# Patient Record
Sex: Female | Born: 1990
Health system: Southern US, Community
[De-identification: ages and names within clinical notes are randomized; demographics above are authoritative.]

## PROBLEM LIST (undated history)

## (undated) ENCOUNTER — Inpatient Hospital Stay (HOSPITAL_COMMUNITY): Payer: Self-pay

## (undated) DIAGNOSIS — O139 Gestational [pregnancy-induced] hypertension without significant proteinuria, unspecified trimester: Secondary | ICD-10-CM

## (undated) DIAGNOSIS — D649 Anemia, unspecified: Secondary | ICD-10-CM

## (undated) DIAGNOSIS — R51 Headache: Secondary | ICD-10-CM

## (undated) DIAGNOSIS — O24419 Gestational diabetes mellitus in pregnancy, unspecified control: Secondary | ICD-10-CM

## (undated) DIAGNOSIS — F329 Major depressive disorder, single episode, unspecified: Secondary | ICD-10-CM

## (undated) DIAGNOSIS — Z202 Contact with and (suspected) exposure to infections with a predominantly sexual mode of transmission: Secondary | ICD-10-CM

## (undated) DIAGNOSIS — I10 Essential (primary) hypertension: Secondary | ICD-10-CM

## (undated) DIAGNOSIS — R519 Headache, unspecified: Secondary | ICD-10-CM

---
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
## 2009-03-05 ENCOUNTER — Emergency Department (HOSPITAL_COMMUNITY): Admission: EM | Admit: 2009-03-05 | Discharge: 2009-03-05 | Payer: Self-pay | Admitting: Emergency Medicine

## 2010-04-24 ENCOUNTER — Emergency Department (HOSPITAL_COMMUNITY): Admission: EM | Admit: 2010-04-24 | Discharge: 2010-04-24 | Payer: Self-pay | Admitting: Emergency Medicine

## 2010-07-05 ENCOUNTER — Emergency Department (HOSPITAL_COMMUNITY): Admission: EM | Admit: 2010-07-05 | Discharge: 2010-07-05 | Payer: Self-pay | Admitting: Emergency Medicine

## 2011-02-02 LAB — URINE MICROSCOPIC-ADD ON

## 2011-02-02 LAB — URINALYSIS, ROUTINE W REFLEX MICROSCOPIC
Protein, ur: 100 mg/dL — AB
Specific Gravity, Urine: 1.039 — ABNORMAL HIGH (ref 1.005–1.030)
pH: 6 (ref 5.0–8.0)

## 2011-02-02 LAB — POCT I-STAT, CHEM 8
BUN: 6 mg/dL (ref 6–23)
Hemoglobin: 12.6 g/dL (ref 12.0–15.0)
Potassium: 3.3 mEq/L — ABNORMAL LOW (ref 3.5–5.1)
Sodium: 139 mEq/L (ref 135–145)
TCO2: 25 mmol/L (ref 0–100)

## 2011-02-02 LAB — RAPID STREP SCREEN (MED CTR MEBANE ONLY): Streptococcus, Group A Screen (Direct): POSITIVE — AB

## 2011-07-22 LAB — RPR: RPR Ser Ql: NONREACTIVE

## 2011-07-22 LAB — RH IMMUNE GLOB WKUP(>/=20WKS)(NOT WOMEN'S HOSP): Fetal Screen: NEGATIVE

## 2011-07-22 LAB — CBC
HCT: 26 — ABNORMAL LOW
HCT: 31.3 — ABNORMAL LOW
HCT: 32.3 — ABNORMAL LOW
Hemoglobin: 10.3 — ABNORMAL LOW
Hemoglobin: 8.6 — ABNORMAL LOW
MCHC: 33
MCHC: 33
MCV: 79.3
MCV: 80.1
Platelets: 187
Platelets: 214
RBC: 3.25 — ABNORMAL LOW
RDW: 14.1

## 2011-07-22 LAB — URINALYSIS, ROUTINE W REFLEX MICROSCOPIC
Glucose, UA: NEGATIVE
Ketones, ur: >80 — AB
Nitrite: NEGATIVE
Urobilinogen, UA: 1

## 2011-07-22 LAB — URINE CULTURE

## 2011-07-22 LAB — URINE MICROSCOPIC-ADD ON

## 2011-08-03 LAB — POCT PREGNANCY, URINE: Operator id: 116391

## 2011-08-05 LAB — GC/CHLAMYDIA PROBE AMP, GENITAL
Chlamydia, DNA Probe: NEGATIVE
GC Probe Amp, Genital: POSITIVE — AB

## 2011-08-05 LAB — WET PREP, GENITAL

## 2012-08-15 ENCOUNTER — Emergency Department (HOSPITAL_COMMUNITY)
Admission: EM | Admit: 2012-08-15 | Discharge: 2012-08-15 | Disposition: A | Discharge status: Home / Self Care | Payer: Self-pay | Attending: Emergency Medicine | Admitting: Emergency Medicine

## 2012-08-15 ENCOUNTER — Encounter (HOSPITAL_COMMUNITY): Payer: Self-pay | Admitting: *Deleted

## 2012-08-15 DIAGNOSIS — F17200 Nicotine dependence, unspecified, uncomplicated: Secondary | ICD-10-CM | POA: Insufficient documentation

## 2012-08-15 DIAGNOSIS — K0889 Other specified disorders of teeth and supporting structures: Secondary | ICD-10-CM

## 2012-08-15 DIAGNOSIS — K089 Disorder of teeth and supporting structures, unspecified: Secondary | ICD-10-CM | POA: Insufficient documentation

## 2012-08-15 MED ORDER — PENICILLIN V POTASSIUM 500 MG PO TABS: 500.0000 mg | ORAL_TABLET | Freq: Three times a day (TID) | ORAL | Status: DC

## 2012-08-15 MED ORDER — HYDROCODONE-ACETAMINOPHEN 5-325 MG PO TABS
1.0000 | ORAL_TABLET | Freq: Once | ORAL | Status: AC
  Administered 2012-08-15: 1 via ORAL
  Filled 2012-08-15: qty 1

## 2012-08-15 MED ORDER — PENICILLIN V POTASSIUM 500 MG PO TABS
500.0000 mg | ORAL_TABLET | Freq: Once | ORAL | Status: AC
  Administered 2012-08-15: 500 mg via ORAL

## 2012-08-15 MED ORDER — PENICILLIN V POTASSIUM 500 MG PO TABS
ORAL_TABLET | ORAL | Status: AC
  Administered 2012-08-15: 500 mg via ORAL
  Filled 2012-08-15: qty 1

## 2012-08-15 MED ORDER — HYDROCODONE-ACETAMINOPHEN 5-500 MG PO TABS: 1.0000 | ORAL_TABLET | Freq: Four times a day (QID) | ORAL | Status: DC | PRN

## 2012-08-15 MED ORDER — PENICILLIN G BENZATHINE & PROC 1200000 UNIT/2ML IM SUSP: 1.2000 10*6.[IU] | Freq: Once | INTRAMUSCULAR | Status: DC

## 2012-08-15 NOTE — ED Notes (Addendum)
Pt reports R lower toothache x 3 days.  Pt reports cavity and does not have a dentist.  Pt also reports h/a and R jaw pain.

## 2012-08-15 NOTE — ED Provider Notes (Signed)
History     CSN: [id number]  Arrival date & time 08/15/12  1250   First MD Initiated Contact with Patient 08/15/12 1336      Chief Complaint  Patient presents with  . Dental Pain    (Consider location/radiation/quality/duration/timing/severity/associated sxs/prior treatment) HPI  21 year old female presents complaining of dental pain. Patient reports for the past 3 days she has had persistent pain to her right lower tooth. Onset was gradual, persistent, radiating, and moderate severity. Pain is described as a throbbing sensation worsening with hot and cold water, or with cold air. Pain  radiates to the right jaw pain, is giving her a headache. She denies any associated swelling. She acknowledged that she has a cavity to the affected tooth. Patient has been tried over-the-counter medication including ibuprofen and Tylenol without adequate relief. She denies fever, rash, hearing changes, or neck pain.  History reviewed. No pertinent past medical history.  History reviewed. No pertinent past surgical history.  No family history on file.  History  Substance Use Topics  . Smoking status: Current Every Day Smoker -- 0.5 packs/day    Types: Cigarettes  . Smokeless tobacco: Not on file  . Alcohol Use: Yes     occa    OB History    Grav Para Term Preterm Abortions TAB SAB Ect Mult Living                  Review of Systems  Constitutional: Negative for fever.  HENT: Positive for dental problem. Negative for sore throat, trouble swallowing and voice change.   Neurological: Negative for numbness.    Allergies  Review of patient's allergies indicates no known allergies.  Home Medications   Current Outpatient Rx  Name Route Sig Dispense Refill  . ACETAMINOPHEN 325 MG PO TABS Oral Take 650 mg by mouth every 6 (six) hours as needed. Pain    . ASPIRIN-SALICYLAMIDE-CAFFEINE 650-195-33.3 MG PO PACK Oral Take 1 Package by mouth as needed. Pain    . IBUPROFEN 200 MG PO TABS Oral  Take 200 mg by mouth every 6 (six) hours as needed. Pain      BP 131/77  Pulse 81  Temp 99.5 F (37.5 C) (Oral)  Resp 20  SpO2 100%  LMP 08/14/2012  Physical Exam  Nursing note and vitals reviewed. Constitutional: She appears well-developed and well-nourished. No distress.  HENT:  Head: Atraumatic.  Right Ear: External ear normal.  Left Ear: External ear normal.  Nose: Nose normal.  Mouth/Throat: Oropharynx is clear and moist. No oropharyngeal exudate.    Eyes: Conjunctivae normal are normal.  Neck: Normal range of motion. Neck supple.  Lymphadenopathy:    She has no cervical adenopathy.  Neurological: She is alert.  Skin: Skin is warm. No rash noted.  Psychiatric: She has a normal mood and affect.    ED Course  Procedures (including critical care time)  Labs Reviewed - No data to display No results found.   No diagnosis found.  1. Dental pain  MDM  Dental decay and dental pain to R lower molar.  Will prescribe abx, pain meds and referral to dentist.    BP 131/77  Pulse 81  Temp 99.5 F (37.5 C) (Oral)  Resp 20  SpO2 100%  LMP 08/14/2012       Fayrene Helper, PA-C 08/15/12 1359

## 2012-08-16 NOTE — ED Provider Notes (Signed)
Medical screening examination/treatment/procedure(s) were performed by non-physician practitioner and as supervising physician I was immediately available for consultation/collaboration.   Kaileen Bronkema M Alanna Storti, DO 08/16/12 2024 

## 2013-03-26 ENCOUNTER — Encounter (HOSPITAL_COMMUNITY): Payer: Self-pay | Admitting: *Deleted

## 2013-03-26 ENCOUNTER — Emergency Department (HOSPITAL_COMMUNITY)
Admission: EM | Admit: 2013-03-26 | Discharge: 2013-03-26 | Disposition: A | Discharge status: Home / Self Care | Payer: Medicaid Other | Attending: Emergency Medicine | Admitting: Emergency Medicine

## 2013-03-26 DIAGNOSIS — G43909 Migraine, unspecified, not intractable, without status migrainosus: Secondary | ICD-10-CM | POA: Insufficient documentation

## 2013-03-26 DIAGNOSIS — K0889 Other specified disorders of teeth and supporting structures: Secondary | ICD-10-CM

## 2013-03-26 DIAGNOSIS — R51 Headache: Secondary | ICD-10-CM | POA: Insufficient documentation

## 2013-03-26 DIAGNOSIS — H9209 Otalgia, unspecified ear: Secondary | ICD-10-CM | POA: Insufficient documentation

## 2013-03-26 DIAGNOSIS — K089 Disorder of teeth and supporting structures, unspecified: Secondary | ICD-10-CM | POA: Insufficient documentation

## 2013-03-26 DIAGNOSIS — R6884 Jaw pain: Secondary | ICD-10-CM | POA: Insufficient documentation

## 2013-03-26 DIAGNOSIS — F17200 Nicotine dependence, unspecified, uncomplicated: Secondary | ICD-10-CM | POA: Insufficient documentation

## 2013-03-26 MED ORDER — OXYCODONE-ACETAMINOPHEN 5-325 MG PO TABS
2.0000 | ORAL_TABLET | Freq: Once | ORAL | Status: AC
  Administered 2013-03-26: 2 via ORAL
  Filled 2013-03-26: qty 2

## 2013-03-26 MED ORDER — PENICILLIN V POTASSIUM 500 MG PO TABS: 500.0000 mg | ORAL_TABLET | Freq: Four times a day (QID) | ORAL | Status: AC

## 2013-03-26 MED ORDER — NAPROXEN 375 MG PO TABS: 375.0000 mg | ORAL_TABLET | Freq: Two times a day (BID) | ORAL | Status: DC

## 2013-03-26 NOTE — ED Notes (Signed)
Pt is here with dental pain to wisdom teeth areas that have caused her ear pain and migraine pain

## 2013-03-26 NOTE — ED Provider Notes (Signed)
History    This chart was scribed for Raymon Mutton, PA working with Ward Givens, MD by ED Scribe, Burman Nieves. This patient was seen in room TR06C/TR06C and the patient's care was started at 7:53 PM.   CSN: [id number]  Arrival date & time 03/26/13  1652   First MD Initiated Contact with Patient 03/26/13 1953      Chief Complaint  Patient presents with  . Dental Pain    (Consider location/radiation/quality/duration/timing/severity/associated sxs/prior treatment) Patient is a 22 y.o. female presenting with tooth pain. The history is provided by the patient. No language interpreter was used.  Dental Pain Quality:  Throbbing Severity:  Moderate Onset quality:  Gradual Timing:  Constant Progression:  Worsening Chronicity:  Recurrent Context: dental fracture   Associated symptoms: headaches    HPI Comments: Laura Leonard is a 22 y.o. female who presents to the Emergency Department complaining of moderate constant throbbing dental pain in her wisdom teeth areas that have caused associated ear pain and migraines that has been going on for the past year and half. Pt states that her upper and lower jaw hurt. Pt states she came to the ED last year with a cracked tooth. She states it has gotten progressively worse since cracked tooth. She states that eating (especially hot and cold foods) exacerbates the pain. She states she takes Tylenol PM to help with sleeping and has taken ibuprofen with no immediate relief. Whenever she touches her tooth to get food out she experiences a sharp piercing pain. Pt denies headaches, chest pain, dizziness, fever, chills, cough, nausea, vomiting, diarrhea, SOB, weakness, and any other associated symptoms. Pt states that she has been unable to get the problem fixed due to not having a dentist and would like to get a referral today.    History reviewed. No pertinent past medical history.  History reviewed. No pertinent past surgical history.  No family  history on file.  History  Substance Use Topics  . Smoking status: Current Every Day Smoker -- 0.50 packs/day    Types: Cigarettes  . Smokeless tobacco: Not on file  . Alcohol Use: Yes     Comment: occa    OB History   Grav Para Term Preterm Abortions TAB SAB Ect Mult Living                  Review of Systems  HENT: Positive for dental problem.   Neurological: Positive for headaches.  All other systems reviewed and are negative.    Allergies  Review of patient's allergies indicates no known allergies.  Home Medications   Current Outpatient Rx  Name  Route  Sig  Dispense  Refill  . acetaminophen (TYLENOL) 500 MG tablet   Oral   Take 1,000 mg by mouth every 2 (two) hours as needed for pain. For pain         . Ibuprofen-Diphenhydramine Cit (ADVIL PM PO)   Oral   Take 4 tablets by mouth daily as needed. For pain         . naproxen (NAPROSYN) 375 MG tablet   Oral   Take 1 tablet (375 mg total) by mouth 2 (two) times daily.   20 tablet   0   . penicillin v potassium (VEETID) 500 MG tablet   Oral   Take 1 tablet (500 mg total) by mouth 4 (four) times daily.   40 tablet   0     BP 133/85  Pulse 84  Temp(Src) 98.2  F (36.8 C) (Oral)  Resp 18  SpO2 99%  LMP 03/08/2013  Physical Exam  Nursing note and vitals reviewed. Constitutional: She is oriented to person, place, and time. She appears well-developed and well-nourished. No distress.  HENT:  Head: Normocephalic and atraumatic.  Mouth/Throat: Uvula is midline. No oropharyngeal exudate.  Cracked upper 2nd molar and bottom 2nd molar on the right side.   Negative facial swelling noted Negative erythema, inflammation, swelling, lesions, abscesses, masses, cysts noted to buccal mucosa Negative sublingual lesion Negative trismus  Eyes: Conjunctivae and EOM are normal. Pupils are equal, round, and reactive to light. No scleral icterus.  Neck: Normal range of motion. Neck supple. No tracheal deviation  present.  Negative neck stiffness Negative nuchal rigidity Negative lymphadenopathy  Negative pain upon palpation to the neck   Cardiovascular: Normal rate, regular rhythm, normal heart sounds and intact distal pulses.  Exam reveals no gallop.   No murmur heard. Pulses:      Radial pulses are 2+ on the right side, and 2+ on the left side.  Pulmonary/Chest: Effort normal and breath sounds normal. No respiratory distress. She has no wheezes. She has no rales. She exhibits no tenderness.  Abdominal: Soft. Bowel sounds are normal. She exhibits no mass. There is no tenderness. There is no guarding.  Musculoskeletal: Normal range of motion.  Lymphadenopathy:    She has no cervical adenopathy.  Neurological: She is alert and oriented to person, place, and time. No cranial nerve deficit. She exhibits normal muscle tone. Coordination normal.  Cranial nerves III-XII grossly intact  Skin: Skin is warm and dry. She is not diaphoretic.  Psychiatric: She has a normal mood and affect. Her behavior is normal.    ED Course  Procedures (including critical care time) DIAGNOSTIC STUDIES: Oxygen Saturation is 99% on room air, normal by my interpretation.    COORDINATION OF CARE:  8:42 PM Discussed ED treatment with pt and pt agrees.    Labs Reviewed - No data to display No results found.   1. Pain, dental       MDM  I personally performed the services described in this documentation, which was scribed in my presence. The recorded information has been reviewed and is accurate.  Patient is stable, afebrile. Dental pain has been ongoing for the past year and a half. Negative trismus, negative sublingual cyst or lesion - ruling out Ludwig's Angina. Negative abscess or cyst formation, inflammation - ruling out possible infection. Negative peritonsillar abscess. Patient discharged. Discharged with antibiotic coverage and anti-inflammatories. Referred to dentist. Rest and hydrate. Discussed to monitor  symptoms and if symptoms are to worsen or change to report back to the ED. Resource guide and dental list given to patient.  Patient agreed to plan of care, understood, all questions answered.    Raymon Mutton, PA-C 03/27/13 [phone]

## 2013-03-27 NOTE — ED Provider Notes (Signed)
Medical screening examination/treatment/procedure(s) were performed by non-physician practitioner and as supervising physician I was immediately available for consultation/collaboration. Devoria Albe, MD, Armando Gang   Ward Givens, MD 03/27/13 [phone]

## 2013-10-27 ENCOUNTER — Emergency Department (HOSPITAL_COMMUNITY)
Admission: EM | Admit: 2013-10-27 | Discharge: 2013-10-27 | Disposition: A | Discharge status: Home / Self Care | Payer: Medicaid Other | Attending: Emergency Medicine | Admitting: Emergency Medicine

## 2013-10-27 ENCOUNTER — Encounter (HOSPITAL_COMMUNITY): Payer: Self-pay | Admitting: Emergency Medicine

## 2013-10-27 DIAGNOSIS — F17200 Nicotine dependence, unspecified, uncomplicated: Secondary | ICD-10-CM | POA: Insufficient documentation

## 2013-10-27 DIAGNOSIS — K0889 Other specified disorders of teeth and supporting structures: Secondary | ICD-10-CM

## 2013-10-27 DIAGNOSIS — K089 Disorder of teeth and supporting structures, unspecified: Secondary | ICD-10-CM | POA: Insufficient documentation

## 2013-10-27 MED ORDER — PENICILLIN V POTASSIUM 500 MG PO TABS: 500.0000 mg | ORAL_TABLET | Freq: Four times a day (QID) | ORAL | Status: DC

## 2013-10-27 MED ORDER — NAPROXEN 500 MG PO TABS: 500.0000 mg | ORAL_TABLET | Freq: Two times a day (BID) | ORAL | Status: DC

## 2013-10-27 MED ORDER — BUPIVACAINE-EPINEPHRINE PF 0.5-1:200000 % IJ SOLN
1.8000 mL | Freq: Once | INTRAMUSCULAR | Status: AC
  Administered 2013-10-27: 9 mg
  Filled 2013-10-27: qty 1.8

## 2013-10-27 NOTE — ED Notes (Signed)
Arrived by EMS for right lower dental pain x 1 week. Pt is stable. In NAD

## 2013-10-27 NOTE — ED Provider Notes (Signed)
CSN: [id number]     Arrival date & time 10/27/13  [id number] History   First MD Initiated Contact with Patient 10/27/13 [phone]     Chief Complaint  Patient presents with  . Dental Pain   (Consider location/radiation/quality/duration/timing/severity/associated sxs/prior Treatment) HPI Comments: Patient is a 23 year old female who presents today by EMS for dental pain. She has had this dental pain for the past couple of years, but worse in the past 3 days. It is a throbbing pain in her right lower molar. The pain radiates into her right ear. She has taken ibuprofen without relief. She reports the pain is so severe she has not been able to sleep. She is sensitive to heat and cold. Pt denies fever, chills, nausea, vomiting, trismus.   The history is provided by the patient. No language interpreter was used.    History reviewed. No pertinent past medical history. History reviewed. No pertinent past surgical history. No family history on file. History  Substance Use Topics  . Smoking status: Current Every Day Smoker -- 0.50 packs/day    Types: Cigarettes  . Smokeless tobacco: Not on file  . Alcohol Use: Yes     Comment: occa   OB History   Grav Para Term Preterm Abortions TAB SAB Ect Mult Living                 Review of Systems  Constitutional: Negative for fever and chills.  HENT: Positive for dental problem. Negative for sore throat and trouble swallowing.   Respiratory: Negative for shortness of breath.   Cardiovascular: Negative for chest pain.  Gastrointestinal: Negative for nausea, vomiting and abdominal pain.  All other systems reviewed and are negative.    Allergies  Review of patient's allergies indicates no known allergies.  Home Medications   Current Outpatient Rx  Name  Route  Sig  Dispense  Refill  . acetaminophen (TYLENOL) 500 MG tablet   Oral   Take 1,000 mg by mouth every 2 (two) hours as needed for pain. For pain         . Ibuprofen-Diphenhydramine Cit (ADVIL PM  PO)   Oral   Take 4 tablets by mouth daily as needed. For pain         . naproxen (NAPROSYN) 375 MG tablet   Oral   Take 1 tablet (375 mg total) by mouth 2 (two) times daily.   20 tablet   0    BP 131/76  Pulse 92  Temp(Src) 98.8 F (37.1 C) (Oral)  Resp 20  SpO2 98% Physical Exam  Nursing note and vitals reviewed. Constitutional: She is oriented to person, place, and time. She appears well-developed and well-nourished. No distress.  HENT:  Head: Normocephalic and atraumatic.  Right Ear: External ear normal.  Left Ear: External ear normal.  Nose: Nose normal.  Mouth/Throat: Oropharynx is clear and moist.  Broken right lower molar.  No trismus, submental edema, tongue elevation.  Eyes: Conjunctivae are normal.  Neck: Normal range of motion.  Cardiovascular: Normal rate, regular rhythm and normal heart sounds.   Pulmonary/Chest: Effort normal and breath sounds normal. No stridor. No respiratory distress. She has no wheezes. She has no rales.  Abdominal: Soft. She exhibits no distension.  Musculoskeletal: Normal range of motion.  Neurological: She is alert and oriented to person, place, and time. She has normal strength.  Skin: Skin is warm and dry. She is not diaphoretic. No erythema.  Psychiatric: She has a normal mood and affect.  Her behavior is normal.    ED Course  Dental Date/Time: 10/27/2013 7:04 AM Performed by: Mora Bellman Authorized by: Mora Bellman Consent: Verbal consent obtained. written consent not obtained. The procedure was performed in an emergent situation. Risks and benefits: risks, benefits and alternatives were discussed Consent given by: patient Patient understanding: patient states understanding of the procedure being performed Patient consent: the patient's understanding of the procedure matches consent given Required items: required blood products, implants, devices, and special equipment available Patient identity confirmed: verbally  with patient and arm band Time out: Immediately prior to procedure a "time out" was called to verify the correct patient, procedure, equipment, support staff and site/side marked as required. Preparation: Patient was prepped and draped in the usual sterile fashion. Local anesthesia used: yes Anesthesia: local infiltration Local anesthetic: bupivacaine 0.25% with epinephrine Anesthetic total: 3.6 ml Patient sedated: no Patient tolerance: Patient tolerated the procedure well with no immediate complications.   (including critical care time) Labs Review Labs Reviewed - No data to display Imaging Review No results found.  EKG Interpretation   None       MDM   1. Pain, dental    Patient with toothache.  No gross abscess.  Exam unconcerning for Ludwig's angina or spread of infection.  Will treat with penicillin and anti inflammatories.  Urged patient to follow-up with dentist.      Mora Bellman, PA-C 10/27/13 [phone]

## 2013-10-27 NOTE — ED Notes (Signed)
Bed: [id number] Expected date:  Expected time:  Means of arrival:  Comments: EMS lower jaw pain

## 2013-10-27 NOTE — Discharge Instructions (Signed)
Dental Pain Toothache is pain in or around a tooth. It may get worse with chewing or with cold or heat.  HOME CARE  Your dentist may use a numbing medicine during treatment. If so, you may need to avoid eating until the medicine wears off. Ask your dentist about this.  Only take medicine as told by your dentist or doctor.  Avoid chewing food near the painful tooth until after all treatment is done. Ask your dentist about this. GET HELP RIGHT AWAY IF:   The problem gets worse or new problems appear.  You have a fever.  There is redness and puffiness (swelling) of the face, jaw, or neck.  You cannot open your mouth.  There is pain in the jaw.  There is very bad pain that is not helped by medicine. MAKE SURE YOU:   Understand these instructions.  Will watch your condition.  Will get help right away if you are not doing well or get worse. Document Released: 03/29/2008 Document Revised: 01/03/2012 Document Reviewed: 03/29/2008 [facility] Patient Information 2014 [place], [place].   Emergency Department Resource Guide 1) Find a Doctor and Pay Out of Pocket Although you won't have to find out who is covered by your insurance plan, it is a good idea to ask around and get recommendations. You will then need to call the office and see if the doctor you have chosen will accept you as a new patient and what types of options they offer for patients who are self-pay. Some doctors offer discounts or will set up payment plans for their patients who do not have insurance, but you will need to ask so you aren't surprised when you get to your appointment.  2) Contact Your Local Health Department Not all health departments have doctors that can see patients for sick visits, but many do, so it is worth a call to see if yours does. If you don't know where your local health department is, you can check in your phone book. The CDC also has a tool to help you locate your state's health department, and many  state websites also have listings of all of their local health departments.  3) Find a [facility] If your illness is not likely to be very severe or complicated, you may want to try a walk in clinic. These are popping up all over the country in pharmacies, drugstores, and shopping centers. They're usually staffed by nurse practitioners or physician assistants that have been trained to treat common illnesses and complaints. They're usually fairly quick and inexpensive. However, if you have serious medical issues or chronic medical problems, these are probably not your best option.  No Primary Care Doctor: - Call Health Connect at  [phone] - they can help you locate a primary care doctor that  accepts your insurance, provides certain services, etc. - Physician Referral Service- [phone]  Chronic Pain Problems: Organization         Address  Phone   Notes  [facility]  [phone] Patients need to be referred by their primary care doctor.   Medication Assistance: Organization         Address  Phone   Notes  [facility] [address] [place],  [address] [phone] --Must be a resident of [facility] -- Must have NO insurance coverage whatsoever (no Medicaid/ Medicare, etc.) -- The pt. MUST have a primary care doctor that directs their care regularly and follows  them in the community   [facility]  [phone]   [employer]  [phone]    Agencies that provide inexpensive medical care: Organization         Address  Phone   Notes  [place]  [phone]   [facility]    [phone]   [facility] [place], [place] [address] [phone]   [place] [address], [place] [phone]   Planned Parenthood    [phone]   [facility]    [phone]   [place] and  [address], [place] Phone:  [phone], Fax:  [phone] Hours of Operation:  9 am - 6 pm, M-F.  Also accepts Medicaid/Medicare and self-pay.  [facility] for [address], [address], [place] Phone: [phone], Fax: [phone]. Hours of Operation:  8:30 am - 5:30 pm, M-F.  Also accepts Medicaid and self-pay.  [facility] [place] [address] [place] Phone: [phone]   [place], [place], [place] [phone], Ext. [phone] Mondays & Thursdays: 7-9 AM.  First 15 patients are seen on a first come, first serve basis.    [place] Providers:  Organization         Address  Phone   Notes  [facility] [address], [place] [phone] Also accepts self-pay patients.  [facility] [address], [place]  [phone]   [place], [address], [place] [phone]   [facility] [address], [place] [phone]   Lucianne Lei [address], [place]   [phone] Only accepts [place] patients after they have their name applied to their card.   Self-Pay (no insurance) in [facility]:  Organization         Address  Phone   Notes  Sickle Cell Patients, [facility] [place] [phone]   [facility] [place] [phone]   [facility] [place]  [place], [place], [place] [phone]   [facility]/Dr. Osei-Bonsu  [address] [place] [address], [place] or [address], [place] [phone] Phone number for both Institute and [place] locations is the same.  [facility] [address], [place] [phone]   [facility] [address] [place] or [address] [phone]   [facility] [address], [place] [phone], phone; [phone], fax Sees patients 1st and 3rd Saturday of every month.  Must not qualify for public or private insurance (i.e. Medicaid, Medicare, [place] Health Choice, Veterans' Benefits)  Household income should be no more than 200% of the poverty level The clinic cannot treat you if you are pregnant or think you are pregnant  Sexually transmitted diseases are not treated at the clinic.    Dental Care: Organization         Address  Phone  Notes  [facility] [address] [place], [place] [phone] Accepts children up to age 15 who are enrolled  in Medicaid or [place] Health Choice; pregnant women with a Medicaid card; and children who have applied for Medicaid or [place] Health Choice, but were declined, whose parents can pay a reduced fee at time of service.  °[facility] [place]  [address], [place] [phone] Accepts children up to age 21 who are enrolled in Medicaid or [place] Health Choice; pregnant women with a Medicaid card; and children who have applied for Medicaid or [place] Health Choice, but were declined, whose parents can pay a reduced fee at time of service.  °[facility] ° [address], [place] [phone] Patients are seen by appointment only. Walk-ins are not accepted. [facility] will see patients 18 years of age and older. °Monday - Tuesday (8am-5pm) °Most Wednesdays (8:30-5pm) °$30 per visit, cash only  °[facility] ° [address], [place] [phone] Patients are seen by appointment only. Walk-ins are not accepted. [facility] will see patients 18 years of age and older. °One Wednesday Evening (Monthly: Volunteer Based).  $30 per visit, cash only  °[facility]  [phone] for adults; Children under age 4, call Graduate Pediatric Dentistry at [phone]. Children aged 4-14, please call [phone] to request a pediatric application. ° Dental services are provided in all areas of dental care including fillings, crowns and bridges, complete and partial dentures, implants, gum treatment, root canals, and extractions. Preventive care is also provided. Treatment is provided to both adults and children. °Patients are selected via a lottery and there is often a waiting list. °  °[facility] [address], °[place] ° ([phone] [email or web address] °  °[facility] [address], [place], [place] ([phone], Ext. [phone] Second and Fourth Thursday of each month, opens at 6:30 AM; Clinic ends at 9 AM.  Patients are seen on a first-come first-served basis, and a limited number are seen during each clinic.  ° °[facility] ° [address], [place], [place] [phone]   Eligibility Requirements °You must have lived in [place], [place], or [place] counties for at least the last three months. °  You cannot be eligible for state or federal sponsored healthcare insurance, including Veterans Administration, Medicaid, or Medicare. °  You generally cannot be eligible for healthcare insurance through your employer.  °  How to apply: °Eligibility screenings are held every Tuesday and Wednesday afternoon from 1:00 pm until 4:00 pm. You do not need an appointment for the interview!  °[facility] [address], [place], [place] [phone]   °[facility]  [phone]   °[facility]  [phone]   °[facility]  336-570-6415   ° °Behavioral Health Resources in the Community: °Intensive Outpatient Programs °Organization         Address  Phone  Notes  °[facility] [address], [place], [place]  [phone]   °[facility] [address], [place], [place] [phone]   °[facility] [address], [place], [place] ° 336-882-2125   °[facility] [address],  °[place], [place] 1-800-853-5163 or 336-641-4981   °Substance Abuse Resources °Organization         Address  Phone  Notes  °[facility]  336-882-2125   °[facility]  336-784-9470   °  [facility] [place]   Chinita Pester  (469)[phone]   Residential & Outpatient Substance Abuse Program  (850) [phone]   Psychological Services Organization         Address  Phone  Notes  [facility] [place]  [place]  ([phone]   [facility] [address] [place] or [phone]    Mobile Crisis Teams Organization         Address  Phone  Notes  [facility], Mobile Crisis Care Unit  [phone]   [facility]  [address] [place], [place]   [place] [address], [place] [phone]    Self-Help/Support Groups Organization         Address  Phone             Notes  [place] [place] - variety of support groups  [place] Call for more information  Narcotics Anonymous (NA), [facility] [address], [employer] [place]  2 meetings at this location   Special educational needs teacher         Address  Phone  Notes  [facility] [place],    [place]  [phone]-([phone]   [facility]  [address], [place] [id number], [place], [place]   [place], [place] [phone] Admissions: 8am-3pm M-F  Incent[facility] Substance [place] [address],    [place], [place] [phone]   [facility] [address] [place], [place], [place]   [facility] [address],    [place], [place]   [facility] - Intensive Outpatient [address], [place], [place]   [facility] ([facility] [place].,  [place], [place] 1-712-332-1299 or [phone]   [facility] ([facility]) [address], [place], [place] Accepts Medicaid  [facility] [place] [address]  [place] 1-646-085-7975 Substance Abuse/Addiction Treatment   [facility] Organization         Address  Phone  Notes  [facility]  [phone]   Domenic Schwab, PhD [address] Arlis Porta [place], [place]   [phone] or [phone]   [facility] [place], [place] [phone]   [facility] [address] [place], [place] [phone] Insurance/Medicaid/sponsorship through [facility] and Families [address] [place]                                    [place], [place] [phone] [place] [address], [place] [phone]    Dr. Adele Schilder  [phone]   [facility] Dept. 1) [address] [place] 2) [place] 3)  [place] [address], [place] [phone]  [phone]   [place] [phone] or [phone] (After Hours)

## 2013-10-27 NOTE — ED Notes (Signed)
Right lower wisdom tooth is broke off. Pt has no dentist.

## 2013-10-28 NOTE — ED Provider Notes (Signed)
Medical screening examination/treatment/procedure(s) were performed by non-physician practitioner and as supervising physician I was immediately available for consultation/collaboration.  EKG Interpretation   None          Prathik Aman M Selby Foisy, MD 10/28/13 0052 

## 2013-11-01 ENCOUNTER — Other Ambulatory Visit (HOSPITAL_COMMUNITY)
Admission: RE | Admit: 2013-11-01 | Discharge: 2013-11-01 | Disposition: A | Discharge status: Home / Self Care | Payer: Medicaid Other | Source: Ambulatory Visit | Attending: Family Medicine | Admitting: Family Medicine

## 2013-11-01 ENCOUNTER — Encounter (HOSPITAL_COMMUNITY): Payer: Self-pay | Admitting: Emergency Medicine

## 2013-11-01 ENCOUNTER — Emergency Department (HOSPITAL_COMMUNITY)
Admission: EM | Admit: 2013-11-01 | Discharge: 2013-11-01 | Disposition: A | Discharge status: Home / Self Care | Payer: Medicaid Other | Source: Home / Self Care

## 2013-11-01 DIAGNOSIS — N760 Acute vaginitis: Secondary | ICD-10-CM | POA: Insufficient documentation

## 2013-11-01 DIAGNOSIS — K089 Disorder of teeth and supporting structures, unspecified: Secondary | ICD-10-CM

## 2013-11-01 DIAGNOSIS — K0889 Other specified disorders of teeth and supporting structures: Secondary | ICD-10-CM

## 2013-11-01 DIAGNOSIS — Z113 Encounter for screening for infections with a predominantly sexual mode of transmission: Secondary | ICD-10-CM | POA: Insufficient documentation

## 2013-11-01 DIAGNOSIS — N898 Other specified noninflammatory disorders of vagina: Secondary | ICD-10-CM

## 2013-11-01 LAB — POCT URINALYSIS DIP (DEVICE)
GLUCOSE, UA: NEGATIVE mg/dL
Ketones, ur: NEGATIVE mg/dL
LEUKOCYTES UA: NEGATIVE
NITRITE: NEGATIVE
PROTEIN: 30 mg/dL — AB
UROBILINOGEN UA: 1 mg/dL (ref 0.0–1.0)
pH: 6.5 (ref 5.0–8.0)

## 2013-11-01 LAB — POCT PREGNANCY, URINE: PREG TEST UR: NEGATIVE

## 2013-11-01 MED ORDER — CEFTRIAXONE SODIUM 1 G IJ SOLR
INTRAMUSCULAR | Status: AC
  Filled 2013-11-01: qty 10

## 2013-11-01 MED ORDER — HYDROCODONE-ACETAMINOPHEN 5-325 MG PO TABS
2.0000 | ORAL_TABLET | ORAL | Status: DC | PRN
Start: 2013-11-01 — End: 2014-10-14

## 2013-11-01 MED ORDER — AZITHROMYCIN 250 MG PO TABS: ORAL_TABLET | ORAL | Status: DC

## 2013-11-01 MED ORDER — AZITHROMYCIN 250 MG PO TABS
ORAL_TABLET | ORAL | Status: AC
Start: 2013-11-01 — End: 2013-11-01
  Filled 2013-11-01: qty 1

## 2013-11-01 MED ORDER — LIDOCAINE HCL (PF) 1 % IJ SOLN
INTRAMUSCULAR | Status: AC
  Filled 2013-11-01: qty 5

## 2013-11-01 MED ORDER — AMOXICILLIN 500 MG PO CAPS
1000.0000 mg | ORAL_CAPSULE | Freq: Three times a day (TID) | ORAL | Status: DC
  Administered 2013-11-01: 1000 mg via ORAL

## 2013-11-01 MED ORDER — CEFTRIAXONE SODIUM 1 G IJ SOLR
1.0000 g | Freq: Once | INTRAMUSCULAR | Status: AC
  Administered 2013-11-01: 1 g via INTRAMUSCULAR

## 2013-11-01 NOTE — ED Notes (Signed)
Pt c/o yellow vag d/c onset 2 weeks... Reports multiple partners have been dx/treated for gonorrhea  Sxs also include: dysuria... She is on her Menstrual Period today Also c/o dental pain onset 2-3 months... Went to [facility] on 10/[facility]/13; given Penicillin and Naproxen... Pt lost Rx for antibiotic She is alert w/no signs of acute distress.

## 2013-11-01 NOTE — Discharge Instructions (Signed)
Dental Pain A tooth ache may be caused by cavities (tooth decay). Cavities expose the nerve of the tooth to air and hot or cold temperatures. It may come from an infection or abscess (also called a boil or furuncle) around your tooth. It is also often caused by dental caries (tooth decay). This causes the pain you are having. DIAGNOSIS  Your caregiver can diagnose this problem by exam. TREATMENT   If caused by an infection, it may be treated with medications which kill germs (antibiotics) and pain medications as prescribed by your caregiver. Take medications as directed.  Only take over-the-counter or prescription medicines for pain, discomfort, or fever as directed by your caregiver.  Whether the tooth ache today is caused by infection or dental disease, you should see your dentist as soon as possible for further care. SEEK MEDICAL CARE IF: The exam and treatment you received today has been provided on an emergency basis only. This is not a substitute for complete medical or dental care. If your problem worsens or new problems (symptoms) appear, and you are unable to meet with your dentist, call or return to this location. SEEK IMMEDIATE MEDICAL CARE IF:   You have a fever.  You develop redness and swelling of your face, jaw, or neck.  You are unable to open your mouth.  You have severe pain uncontrolled by pain medicine. MAKE SURE YOU:   Understand these instructions.  Will watch your condition.  Will get help right away if you are not doing well or get worse. Document Released: 10/11/2005 Document Revised: 01/03/2012 Document Reviewed: 05/29/2008 [facility] Patient Information 2014 [place]. Chlamydia, Female Chlamydia is an infection caused by a certain type of germ (bacteria). The germs are spread from one person to another person during sexual contact. This infection can be in the cervix, urine tube (urethra), throat, or bottom (rectum). This infection needs  treatment. HOME CARE   Take your medicines (antibiotics) as told. Finish them even if you start to feel better.  Only take medicine as told by your doctor.  Tell your sex partner(s) that you have chlamydia. They must also take medicine.  Do not have sex until your doctor says it is okay.  Rest.  Eat healthy. Drink enough fluids to keep your pee (urine) clear or pale yellow.  Keep all doctor visits as told. GET HELP RIGHT AWAY IF:   Your problems return.  You have a fever. MAKE SURE YOU:   Understand these instructions.  Will watch your condition.  Will get help right away if you are not doing well or get worse. Document Released: 07/20/2008 Document Revised: 01/03/2012 Document Reviewed: 07/20/2008 [facility] Patient Information 2014 [place]. Gonorrhea, Females and Males Gonorrhea is an infection. Gonorrhea can be treated with medicines that kill germs (antibiotics). It is necessary that all your sexual partners also be tested for infection and possibly be treated.  CAUSES  Gonorrhea is caused by a germ (bacteria) called Neisseria gonorrhoeae. This infection is spread by sexual contact. The contact that spreads gonorrhea from person to person may be oral, anal, or genital sex. SYMPTOMS  Females A woman may have gonorrhea infection and no symptoms. The most common symptoms are:  Pain in the lower abdomen.  Fever, with or without chills. When these are the most serious problems, the illness is commonly called pelvic inflammatory disease (PID). Other symptoms include:  Abnormal vaginal discharge.  Painful intercourse.  Burning or itching of the vagina or lips of the vagina.  Abnormal vaginal bleeding.  Pain when urinating. If the infection is spread by anal sex:  Irritation, pain, bleeding, or discharge from the rectum. If the infection is spread by oral sex with either a man or a woman:  Sore throat, fever, and swollen neck lymph glands. Other problems  may include:  Long-lasting (chronic) pain in the lower abdomen during menstruation, intercourse, or at other times.  Inability to become pregnant.  Premature birth.  Passing the infection onto a newborn baby. This can cause an eye infection in the infant or more serious health problems. Males Less frequently than in women, men may have gonorrhea infection and no symptoms. The most common symptoms are:  Discharge from the penis.  Pain or burning during urination. If the infection is spread by anal sex:  Irritation, pain, bleeding, or discharge from the rectum. If the infection is spread by oral sex with either a man or a woman:  Sore throat, fever, and swollen neck lymph glands. DIAGNOSIS  Diagnosis is made by exam of the patient and checking a sample of discharge under a microscope for the presence of the bacteria. Discharge may be taken from the urethra, cervix, throat, or rectum. TREATMENT  It is important to diagnose and treat gonorrhea as soon as possible. This prevents damage to the female or female organs or harm to the newborn baby of an infected woman.  Antibiotics are used to treat gonorrhea.  Your sex partners should also be examined and treated if needed.  Testing and treatment for other sexually transmitted diseases (STDs) may be done when you are diagnosed with gonorrhea. Gonorrhea is an STD. You are at risk for other STDs, which are often transmitted around the same time as gonorrhea. These include:  Chlamydia.  Syphilis.  Trichomonas.  Human papillomavirus (HPV).  Human immunodeficiency virus (HIV).  If left untreated, PID can cause women to be unable to have children (sterile). To prevent sterility in females, it is important to be treated as soon as possible and finish all medicines. Unfortunately, sterility or pregnancy occurring outside the uterus (ectopic) may still occur in fully treated women. HOME CARE INSTRUCTIONS   Finish all medicine as prescribed.  Incomplete treatment will put you at risk for continued infection.  Only take over-the-counter or prescription medicines for pain, discomfort, or fever as directed by your caregiver.  Do not have sex until treatment is completed, or as instructed by your caregiver.  Follow up with your caregiver as directed.  If you test positive for gonorrhea, inform your recent sexual partners. They may need an exam and treatment, even if they have no symptoms. They may need treatment even if they test negative for gonorrhea. Finding out the results of your test Not all test results are available during your visit. If your test results are not back during the visit, make an appointment with your caregiver to find out the results. Do not assume everything is normal if you have not heard from your caregiver or the medical facility. It is important for you to follow up on all of your test results. SEEK MEDICAL CARE IF:   You develop any bad reaction to the medicine you were prescribed. This may include:  Rash.  Nausea.  Vomiting.  Diarrhea.  You have an oral temperature above 102 F (38.9 C).  You have symptoms that do not improve, symptoms that get worse, or you develop increased pain. Males may get pain in the testicles and females may get increased abdominal pain.  MAKE SURE YOU:   Understand these instructions.  Will watch your condition.  Will get help right away if you are not doing well or get worse. Document Released: 10/08/2000 Document Revised: 01/03/2012 Document Reviewed: 04/18/2013 [facility] Patient Information 2014 [place], [place].

## 2013-11-01 NOTE — ED Notes (Signed)
Call back number verified.  

## 2013-11-01 NOTE — ED Provider Notes (Signed)
CSN: [id number]     Arrival date & time 11/01/13  1553 History   None    Chief Complaint  Patient presents with  . Vaginal Discharge  . Dental Pain   (Consider location/radiation/quality/duration/timing/severity/associated sxs/prior Treatment) HPI Comments: 23 yo female presents with + exposure to G/C and would like treatment. She notes mild discharge before current menses began with odor. She notes mild abdomen discomfort with menses cramping.   She also wants some pain medicine for her 23 yo broken tooth. She notes pain has been worse over last week and her dentist appointment for today was canceled by dental office. She notes she did not have tooth fixed before due to cost. She now has Medicaid and wants tooth fixed.   History reviewed. No pertinent past medical history. History reviewed. No pertinent past surgical history. No family history on file. History  Substance Use Topics  . Smoking status: Current Every Day Smoker -- 0.50 packs/day    Types: Cigarettes  . Smokeless tobacco: Not on file  . Alcohol Use: Yes     Comment: occa   OB History   Grav Para Term Preterm Abortions TAB SAB Ect Mult Living                 Review of Systems  HENT: Positive for dental problem.   Genitourinary: Positive for vaginal discharge.  All other systems reviewed and are negative.    Allergies  Review of patient's allergies indicates no known allergies.  Home Medications   Current Outpatient Rx  Name  Route  Sig  Dispense  Refill  . acetaminophen (TYLENOL) 500 MG tablet   Oral   Take 1,000 mg by mouth every 6 (six) hours as needed for moderate pain (tooth pain). For pain         . naproxen (NAPROSYN) 500 MG tablet   Oral   Take 1 tablet (500 mg total) by mouth 2 (two) times daily with a meal.   30 tablet   0   . penicillin v potassium (VEETID) 500 MG tablet   Oral   Take 1 tablet (500 mg total) by mouth 4 (four) times daily.   40 tablet   0    BP 120/84  Pulse 75   Temp(Src) 98 F (36.7 C) (Oral)  Resp 16  SpO2 100%  LMP 11/01/2013 Physical Exam  Nursing note and vitals reviewed. Constitutional: She is oriented to person, place, and time. She appears well-developed and well-nourished. No distress.  HENT:  Head: Normocephalic and atraumatic.  Right Ear: External ear normal.  Mouth/Throat:    Eyes: Conjunctivae and EOM are normal.  Neck: Normal range of motion. Neck supple.  Cardiovascular: Normal rate, regular rhythm, normal heart sounds and intact distal pulses.   Pulmonary/Chest: Effort normal and breath sounds normal.  Abdominal: Soft. Bowel sounds are normal. She exhibits no distension and no mass. There is no tenderness. There is no rebound and no guarding.  Genitourinary: Vaginal discharge found.  Patient declined chaperone in room. Probes performed with foul odor and brown discharge, patient on menses. Minimal tenderness with manual exam.  Musculoskeletal: Normal range of motion. She exhibits no edema and no tenderness.  Lymphadenopathy:    She has no cervical adenopathy.  Neurological: She is alert and oriented to person, place, and time. No cranial nerve deficit.  Skin: Skin is warm and dry. No rash noted. No erythema. No pallor.  Psychiatric: She has a normal mood and affect. Her behavior is normal.  Judgment and thought content normal.    ED Course  Procedures (including critical care time) Labs Review Labs Reviewed  POCT URINALYSIS DIP (DEVICE) - Abnormal; Notable for the following:    Bilirubin Urine SMALL (*)    Hgb urine dipstick MODERATE (*)    Protein, ur 30 (*)    All other components within normal limits  POCT PREGNANCY, URINE   Imaging Review No results found.    MDM  1. Probable G/C Rocephin 1 Gm, Amoxicillin 1000 mg in office. Zpak AD. Hygiene explained, pathology pending. 2. Dental pain with hx of 23 year old broken tooth- Advised needs dental Evaluation ASAP. Norco 5/ 325 AD  Berenice PrimasMelissa R Reilly Blades, PA-C 11/01/13  2140

## 2013-11-02 ENCOUNTER — Telehealth (HOSPITAL_COMMUNITY): Payer: Self-pay | Admitting: *Deleted

## 2013-11-02 NOTE — ED Notes (Signed)
No working phone numbers for pt. Confidential marked letter sent to pt. with lab result and where to pick up her Rx. Vassie MoselleYork, Dareth Andrew M 11/02/2013

## 2013-11-02 NOTE — ED Notes (Addendum)
GC/Chlamydia neg., Affirm: Candida and Trich neg., Gardnerella pos.  Discussed with Dr. Artis FlockKindl and he wrote an order for Flagyl 500 mg. 1 BID #14.  I called both of pt.'s numbers and both are incorrect. I called contact and left a message for pt. to call me. Call 1.  Vassie MoselleYork, Lexii Walsh M 11/02/2013

## 2013-11-04 NOTE — ED Provider Notes (Signed)
Medical screening examination/treatment/procedure(s) were performed by resident physician or non-physician practitioner and as supervising physician I was immediately available for consultation/collaboration.   Hasaan Radde DOUGLAS MD.   Quanasia Defino D Kekai Geter, MD 11/04/13 1043 

## 2014-01-03 ENCOUNTER — Telehealth (HOSPITAL_COMMUNITY): Payer: Self-pay | Admitting: *Deleted

## 2014-01-03 NOTE — ED Notes (Signed)
Letter returned unopened.  Unable to contact pt. Vassie MoselleYork, Kimara Bencomo M 01/03/2014

## 2014-10-14 ENCOUNTER — Inpatient Hospital Stay (HOSPITAL_COMMUNITY): Payer: Medicaid Other

## 2014-10-14 ENCOUNTER — Inpatient Hospital Stay (HOSPITAL_COMMUNITY)
Admission: AD | Admit: 2014-10-14 | Discharge: 2014-10-14 | Disposition: A | Discharge status: Home / Self Care | Payer: Medicaid Other | Source: Ambulatory Visit | Attending: Obstetrics & Gynecology | Admitting: Obstetrics & Gynecology

## 2014-10-14 ENCOUNTER — Encounter (HOSPITAL_COMMUNITY): Payer: Self-pay | Admitting: *Deleted

## 2014-10-14 DIAGNOSIS — O23591 Infection of other part of genital tract in pregnancy, first trimester: Secondary | ICD-10-CM

## 2014-10-14 DIAGNOSIS — N760 Acute vaginitis: Secondary | ICD-10-CM | POA: Insufficient documentation

## 2014-10-14 DIAGNOSIS — B9689 Other specified bacterial agents as the cause of diseases classified elsewhere: Secondary | ICD-10-CM | POA: Diagnosis not present

## 2014-10-14 DIAGNOSIS — F17200 Nicotine dependence, unspecified, uncomplicated: Secondary | ICD-10-CM

## 2014-10-14 DIAGNOSIS — F17210 Nicotine dependence, cigarettes, uncomplicated: Secondary | ICD-10-CM | POA: Diagnosis not present

## 2014-10-14 DIAGNOSIS — O26891 Other specified pregnancy related conditions, first trimester: Secondary | ICD-10-CM | POA: Diagnosis not present

## 2014-10-14 DIAGNOSIS — O3680X0 Pregnancy with inconclusive fetal viability, not applicable or unspecified: Secondary | ICD-10-CM

## 2014-10-14 DIAGNOSIS — Z3A19 19 weeks gestation of pregnancy: Secondary | ICD-10-CM

## 2014-10-14 DIAGNOSIS — R52 Pain, unspecified: Secondary | ICD-10-CM | POA: Diagnosis not present

## 2014-10-14 DIAGNOSIS — Z3A01 Less than 8 weeks gestation of pregnancy: Secondary | ICD-10-CM | POA: Diagnosis not present

## 2014-10-14 DIAGNOSIS — O4412 Placenta previa with hemorrhage, second trimester: Secondary | ICD-10-CM

## 2014-10-14 DIAGNOSIS — O99331 Smoking (tobacco) complicating pregnancy, first trimester: Secondary | ICD-10-CM | POA: Insufficient documentation

## 2014-10-14 DIAGNOSIS — R109 Unspecified abdominal pain: Secondary | ICD-10-CM | POA: Diagnosis present

## 2014-10-14 DIAGNOSIS — O0933 Supervision of pregnancy with insufficient antenatal care, third trimester: Secondary | ICD-10-CM

## 2014-10-14 HISTORY — DX: Headache: R51

## 2014-10-14 HISTORY — DX: Headache, unspecified: R51.9

## 2014-10-14 HISTORY — DX: Contact with and (suspected) exposure to infections with a predominantly sexual mode of transmission: Z20.2

## 2014-10-14 HISTORY — DX: Major depressive disorder, single episode, unspecified: F32.9

## 2014-10-14 LAB — RAPID URINE DRUG SCREEN, HOSP PERFORMED
AMPHETAMINES: NOT DETECTED
BARBITURATES: NOT DETECTED
Benzodiazepines: NOT DETECTED
Cocaine: NOT DETECTED
OPIATES: NOT DETECTED
Tetrahydrocannabinol: POSITIVE — AB

## 2014-10-14 LAB — URINALYSIS, ROUTINE W REFLEX MICROSCOPIC
BILIRUBIN URINE: NEGATIVE
Glucose, UA: NEGATIVE mg/dL
KETONES UR: NEGATIVE mg/dL
LEUKOCYTES UA: NEGATIVE
NITRITE: NEGATIVE
PH: 6 (ref 5.0–8.0)
PROTEIN: NEGATIVE mg/dL
Specific Gravity, Urine: 1.01 (ref 1.005–1.030)
UROBILINOGEN UA: 0.2 mg/dL (ref 0.0–1.0)

## 2014-10-14 LAB — CBC
HEMATOCRIT: 36.5 % (ref 36.0–46.0)
HEMOGLOBIN: 12.2 g/dL (ref 12.0–15.0)
MCH: 26.7 pg (ref 26.0–34.0)
MCHC: 33.4 g/dL (ref 30.0–36.0)
MCV: 79.9 fL (ref 78.0–100.0)
Platelets: 296 10*3/uL (ref 150–400)
RBC: 4.57 MIL/uL (ref 3.87–5.11)
RDW: 13.9 % (ref 11.5–15.5)
WBC: 11 10*3/uL — ABNORMAL HIGH (ref 4.0–10.5)

## 2014-10-14 LAB — URINE MICROSCOPIC-ADD ON

## 2014-10-14 LAB — WET PREP, GENITAL
Trich, Wet Prep: NONE SEEN
YEAST WET PREP: NONE SEEN

## 2014-10-14 LAB — HCG, QUANTITATIVE, PREGNANCY: hCG, Beta Chain, Quant, S: 1828 m[IU]/mL — ABNORMAL HIGH (ref <5)

## 2014-10-14 LAB — POCT PREGNANCY, URINE: Preg Test, Ur: POSITIVE — AB

## 2014-10-14 LAB — ABO/RH: ABO/RH(D): B NEG

## 2014-10-14 MED ORDER — PROMETHAZINE HCL 25 MG PO TABS
25.0000 mg | ORAL_TABLET | Freq: Once | ORAL | Status: AC
  Administered 2014-10-14: 25 mg via ORAL
  Filled 2014-10-14: qty 1

## 2014-10-14 MED ORDER — OXYCODONE-ACETAMINOPHEN 5-325 MG PO TABS
1.0000 | ORAL_TABLET | Freq: Once | ORAL | Status: AC
  Administered 2014-10-14: 1 via ORAL
  Filled 2014-10-14: qty 1

## 2014-10-14 MED ORDER — RHO D IMMUNE GLOBULIN 1500 UNIT/2ML IJ SOSY
300.0000 ug | PREFILLED_SYRINGE | Freq: Once | INTRAMUSCULAR | Status: AC
  Administered 2014-10-14: 300 ug via INTRAMUSCULAR
  Filled 2014-10-14: qty 2

## 2014-10-14 MED ORDER — METRONIDAZOLE 500 MG PO TABS: 500.0000 mg | ORAL_TABLET | Freq: Two times a day (BID) | ORAL | Status: DC

## 2014-10-14 NOTE — MAU Note (Signed)
Sharp lower abd pain 2 days ago, also started bleeding.  Having mild cramping today, no bleeding today.  Pos HPT today.

## 2014-10-14 NOTE — MAU Provider Note (Signed)
History     CSN: [id number]  Arrival date and time: 10/14/14 4098   First Provider Initiated Contact with Patient 10/14/14 1932      Chief Complaint  Patient presents with  . Abdominal Pain   HPI Comments: Laura Leonard 23 y.o. [id number] [redacted]w[redacted]d presents to MAU for abdominal pain and bleeding ongoing x 2 days. Her pain is 6/10 and she took tylenol and Viciden wuth no relief. She will go back to [facility] for her care.   Abdominal Pain      Past Medical History  Diagnosis Date  . Depression   . Headache   . Trichomonas contact     History reviewed. No pertinent past surgical history.  History reviewed. No pertinent family history.  History  Substance Use Topics  . Smoking status: Current Every Day Smoker -- 0.50 packs/day    Types: Cigarettes  . Smokeless tobacco: Not on file  . Alcohol Use: Yes     Comment: occa    Allergies: No Known Allergies  Prescriptions prior to admission  Medication Sig Dispense Refill Last Dose  . acetaminophen (TYLENOL) 500 MG tablet Take 1,000 mg by mouth every 6 (six) hours as needed for moderate pain (tooth pain). For pain   Past Month at Unknown time  . HYDROcodone-acetaminophen (NORCO/VICODIN) 5-325 MG per tablet Take 2 tablets by mouth every 4 (four) hours as needed. 10 tablet 0 Past Week at Unknown time  . azithromycin (ZITHROMAX) 250 MG tablet Take 2 tablets (500 mg) on  Day 1,  followed by 1 tablet (250 mg) once daily on Days 2 through 5. (Patient not taking: Reported on 10/14/2014) 6 each 0 Not Taking at Unknown time  . naproxen (NAPROSYN) 500 MG tablet Take 1 tablet (500 mg total) by mouth 2 (two) times daily with a meal. (Patient not taking: Reported on 10/14/2014) 30 tablet 0 Not Taking at Unknown time  . penicillin v potassium (VEETID) 500 MG tablet Take 1 tablet (500 mg total) by mouth 4 (four) times daily. (Patient not taking: Reported on 10/14/2014) 40 tablet 0 Not Taking at Unknown time    Review of Systems   Constitutional: Negative.   HENT: Negative.   Eyes: Negative.   Respiratory: Negative.   Cardiovascular: Negative.   Gastrointestinal: Positive for abdominal pain.  Genitourinary: Negative.   Musculoskeletal: Negative.   Skin: Negative.   Neurological: Negative.   Psychiatric/Behavioral: Negative.    Physical Exam   Blood pressure 124/69, pulse 112, temperature 98.2 F (36.8 C), temperature source Oral, resp. rate 18, height 5\' 4"  (1.626 m), weight 106.777 kg (235 lb 6.4 oz), last menstrual period 09/08/2014.  Physical Exam  Constitutional: She is oriented to person, place, and time. She appears well-developed and well-nourished. No distress.  HENT:  Head: Normocephalic and atraumatic.  GI: Soft. She exhibits no distension. There is no tenderness. There is no rebound.  Genitourinary:  Genital:external odor Vaginal: small amount yellow odorous discharge Cervix:closed/ thick Bimanual: tenderness   Musculoskeletal: Normal range of motion.  Neurological: She is alert and oriented to person, place, and time.  Skin: Skin is warm and dry.  Psychiatric: She has a normal mood and affect. Her behavior is normal. Judgment and thought content normal.    MAU Course  Procedures  MDM  Wet prep, GC, Chlamydia, CBC, UA, U/S, ABORh, Quant, HIV, urine drug screen Percocet/ phenergan for pain Laura Leonard 10/14/2014, 8:06 PM  Care transferred to D. Cristel Rail, CNMW at 8 pm  Laura Leonard  Laura Leonard 10/14/2014, 8:06 PM   Results for orders placed or performed during the hospital encounter of 10/14/14 (from the past 24 hour(s))  Urinalysis, Routine w reflex microscopic     Status: Abnormal   Collection Time: 10/14/14  7:05 PM  Result Value Ref Range   Color, Urine YELLOW YELLOW   APPearance CLEAR CLEAR   Specific Gravity, Urine 1.010 1.005 - 1.030   pH 6.0 5.0 - 8.0   Glucose, UA NEGATIVE NEGATIVE mg/dL   Hgb urine dipstick TRACE (A) NEGATIVE   Bilirubin Urine NEGATIVE NEGATIVE    Ketones, ur NEGATIVE NEGATIVE mg/dL   Protein, ur NEGATIVE NEGATIVE mg/dL   Urobilinogen, UA 0.2 0.0 - 1.0 mg/dL   Nitrite NEGATIVE NEGATIVE   Leukocytes, UA NEGATIVE NEGATIVE  Urine microscopic-add on     Status: Abnormal   Collection Time: 10/14/14  7:05 PM  Result Value Ref Range   Squamous Epithelial / LPF RARE RARE   WBC, UA 0-2 <3 WBC/hpf   RBC / HPF 0-2 <3 RBC/hpf   Bacteria, UA FEW (A) RARE  Drug screen panel, emergency     Status: Abnormal   Collection Time: 10/14/14  7:05 PM  Result Value Ref Range   Opiates NONE DETECTED NONE DETECTED   Cocaine NONE DETECTED NONE DETECTED   Benzodiazepines NONE DETECTED NONE DETECTED   Amphetamines NONE DETECTED NONE DETECTED   Tetrahydrocannabinol POSITIVE (A) NONE DETECTED   Barbiturates NONE DETECTED NONE DETECTED  Wet prep, genital     Status: Abnormal   Collection Time: 10/14/14  7:18 PM  Result Value Ref Range   Yeast Wet Prep HPF POC NONE SEEN NONE SEEN   Trich, Wet Prep NONE SEEN NONE SEEN   Clue Cells Wet Prep HPF POC MODERATE (A) NONE SEEN   WBC, Wet Prep HPF POC FEW (A) NONE SEEN  Pregnancy, urine POC     Status: Abnormal   Collection Time: 10/14/14  7:23 PM  Result Value Ref Range   Preg Test, Ur POSITIVE (A) NEGATIVE  CBC     Status: Abnormal   Collection Time: 10/14/14  7:45 PM  Result Value Ref Range   WBC 11.0 (H) 4.0 - 10.5 K/uL   RBC 4.57 3.87 - 5.11 MIL/uL   Hemoglobin 12.2 12.0 - 15.0 g/dL   HCT 16.136.5 09.636.0 - 04.546.0 %   MCV 79.9 78.0 - 100.0 fL   MCH 26.7 26.0 - 34.0 pg   MCHC 33.4 30.0 - 36.0 g/dL   RDW 40.913.9 81.111.5 - 91.415.5 %   Platelets 296 150 - 400 K/uL  hCG, quantitative, pregnancy     Status: Abnormal   Collection Time: 10/14/14  7:46 PM  Result Value Ref Range   hCG, Beta Chain, Quant, S 1828 (H) <5 mIU/mL  ABO/Rh     Status: None   Collection Time: 10/14/14  7:46 PM  Result Value Ref Range   ABO/RH(D) B NEG   Rh IG workup (includes ABO/Rh)     Status: None (Preliminary result)   Collection Time:  10/14/14  7:46 PM  Result Value Ref Range   Gestational Age(Wks) 5    ABO/RH(D) B NEG    Antibody Screen NEG    Unit Number [id number]    Blood Component Type RHIG    Unit division 00    Status of Unit ISSUED    Transfusion Status OK TO TRANSFUSE      Hx red-brown light vaginal bleed 2 days ago and yesterday> Rh IG given  Assessment and Plan   1. Pregnancy, location unknown   2. Pain   3. BV (bacterial vaginosis)      Medication List    STOP taking these medications        azithromycin 250 MG tablet  Commonly known as:  ZITHROMAX     HYDROcodone-acetaminophen 5-325 MG per tablet  Commonly known as:  NORCO/VICODIN     naproxen 500 MG tablet  Commonly known as:  NAPROSYN     penicillin v potassium 500 MG tablet  Commonly known as:  VEETID      TAKE these medications        acetaminophen 500 MG tablet  Commonly known as:  TYLENOL  Take 1,000 mg by mouth every 6 (six) hours as needed for moderate pain (tooth pain). For pain     metroNIDAZOLE 500 MG tablet  Commonly known as:  FLAGYL  Take 1 tablet (500 mg total) by mouth 2 (two) times daily.       Follow-up Information    Follow up with Nursepractioner Mau, NP On 10/16/2014.   Why:  Repeat blood test      10/14/2014, 8:06 PM

## 2014-10-14 NOTE — Discharge Instructions (Signed)
Vaginal Bleeding During Pregnancy, First Trimester °A small amount of bleeding (spotting) from the vagina is relatively common in early pregnancy. It usually stops on its own. Various things may cause bleeding or spotting in early pregnancy. Some bleeding may be related to the pregnancy, and some may not. In most cases, the bleeding is normal and is not a problem. However, bleeding can also be a sign of something serious. Be sure to tell your health care provider about any vaginal bleeding right away. °Some possible causes of vaginal bleeding during the first trimester include: °· Infection or inflammation of the cervix. °· Growths (polyps) on the cervix. °· Miscarriage or threatened miscarriage. °· Pregnancy tissue has developed outside of the uterus and in a fallopian tube (tubal pregnancy). °· Tiny cysts have developed in the uterus instead of pregnancy tissue (molar pregnancy). °HOME CARE INSTRUCTIONS  °Watch your condition for any changes. The following actions may help to lessen any discomfort you are feeling: °· Follow your health care provider's instructions for limiting your activity. If your health care provider orders bed rest, you may need to stay in bed and only get up to use the bathroom. However, your health care provider may allow you to continue light activity. °· If needed, make plans for someone to help with your regular activities and responsibilities while you are on bed rest. °· Keep track of the number of pads you use each day, how often you change pads, and how soaked (saturated) they are. Write this down. °· Do not use tampons. Do not douche. °· Do not have sexual intercourse or orgasms until approved by your health care provider. °· If you pass any tissue from your vagina, save the tissue so you can show it to your health care provider. °· Only take over-the-counter or prescription medicines as directed by your health care provider. °· Do not take aspirin because it can make you  bleed. °· Keep all follow-up appointments as directed by your health care provider. °SEEK MEDICAL CARE IF: °· You have any vaginal bleeding during any part of your pregnancy. °· You have cramps or labor pains. °· You have a fever, not controlled by medicine. °SEEK IMMEDIATE MEDICAL CARE IF:  °· You have severe cramps in your back or belly (abdomen). °· You pass large clots or tissue from your vagina. °· Your bleeding increases. °· You feel light-headed or weak, or you have fainting episodes. °· You have chills. °· You are leaking fluid or have a gush of fluid from your vagina. °· You pass out while having a bowel movement. °MAKE SURE YOU: °· Understand these instructions. °· Will watch your condition. °· Will get help right away if you are not doing well or get worse. °Document Released: 07/21/2005 Document Revised: 10/16/2013 Document Reviewed: 06/18/2013 °ExitCare® Patient Information ©2015 [employer]. This information is not intended to replace advice given to you by your health care provider. Make sure you discuss any questions you have with your health care provider. °Bacterial Vaginosis °Bacterial vaginosis is a vaginal infection that occurs when the normal balance of bacteria in the vagina is disrupted. It results from an overgrowth of certain bacteria. This is the most common vaginal infection in women of childbearing age. Treatment is important to prevent complications, especially in pregnant women, as it can cause a premature delivery. °CAUSES  °Bacterial vaginosis is caused by an increase in harmful bacteria that are normally present in smaller amounts in the vagina. Several different kinds of bacteria can   cause bacterial vaginosis. However, the reason that the condition develops is not fully understood. RISK FACTORS Certain activities or behaviors can put you at an increased risk of developing bacterial vaginosis, including:  Having a new sex partner or multiple sex partners.  Douching.  Using  an intrauterine device (IUD) for contraception. Women do not get bacterial vaginosis from toilet seats, bedding, swimming pools, or contact with objects around them. SIGNS AND SYMPTOMS  Some women with bacterial vaginosis have no signs or symptoms. Common symptoms include:  Grey vaginal discharge.  A fishlike odor with discharge, especially after sexual intercourse.  Itching or burning of the vagina and vulva.  Burning or pain with urination. DIAGNOSIS  Your health care provider will take a medical history and examine the vagina for signs of bacterial vaginosis. A sample of vaginal fluid may be taken. Your health care provider will look at this sample under a microscope to check for bacteria and abnormal cells. A vaginal pH test may also be done.  TREATMENT  Bacterial vaginosis may be treated with antibiotic medicines. These may be given in the form of a pill or a vaginal cream. A second round of antibiotics may be prescribed if the condition comes back after treatment.  HOME CARE INSTRUCTIONS   Only take over-the-counter or prescription medicines as directed by your health care provider.  If antibiotic medicine was prescribed, take it as directed. Make sure you finish it even if you start to feel better.  Do not have sex until treatment is completed.  Tell all sexual partners that you have a vaginal infection. They should see their health care provider and be treated if they have problems, such as a mild rash or itching.  Practice safe sex by using condoms and only having one sex partner. SEEK MEDICAL CARE IF:   Your symptoms are not improving after 3 days of treatment.  You have increased discharge or pain.  You have a fever. MAKE SURE YOU:   Understand these instructions.  Will watch your condition.  Will get help right away if you are not doing well or get worse. FOR MORE INFORMATION  Centers for Disease Control and Prevention, Division of STD Prevention:  SolutionApps.co.zawww.cdc.gov/std American Sexual Health Association (ASHA): www.ashastd.org  Document Released: 10/11/2005 Document Revised: 08/01/2013 Document Reviewed: 05/23/2013 [facility] Patient Information 2015 [place], [place]. This information is not intended to replace advice given to you by your health care provider. Make sure you discuss any questions you have with your health care provider.

## 2014-10-15 LAB — RH IG WORKUP (INCLUDES ABO/RH)
ABO/RH(D): B NEG
Antibody Screen: NEGATIVE
GESTATIONAL AGE(WKS): 5
Unit division: 0

## 2014-10-23 LAB — GC/CHLAMYDIA PROBE AMP

## 2014-10-23 LAB — HIV ANTIBODY (ROUTINE TESTING W REFLEX)

## 2014-11-08 LAB — HIV ANTIBODY: HIV 1&2 Ab, 4th Generation: NONREACTIVE

## 2014-12-11 ENCOUNTER — Emergency Department (HOSPITAL_COMMUNITY)
Admission: EM | Admit: 2014-12-11 | Discharge: 2014-12-11 | Disposition: A | Discharge status: Home / Self Care | Payer: Medicaid Other | Source: Home / Self Care | Attending: Family Medicine | Admitting: Family Medicine

## 2014-12-11 ENCOUNTER — Encounter (HOSPITAL_COMMUNITY): Payer: Self-pay | Admitting: *Deleted

## 2014-12-11 DIAGNOSIS — M545 Low back pain, unspecified: Secondary | ICD-10-CM

## 2014-12-11 LAB — POCT URINALYSIS DIP (DEVICE)
Glucose, UA: NEGATIVE mg/dL
KETONES UR: NEGATIVE mg/dL
Nitrite: NEGATIVE
PH: 6.5 (ref 5.0–8.0)
Protein, ur: 30 mg/dL — AB
SPECIFIC GRAVITY, URINE: 1.025 (ref 1.005–1.030)
Urobilinogen, UA: 0.2 mg/dL (ref 0.0–1.0)

## 2014-12-11 LAB — POCT PREGNANCY, URINE: Preg Test, Ur: NEGATIVE

## 2014-12-11 MED ORDER — IBUPROFEN 800 MG PO TABS
ORAL_TABLET | ORAL | Status: AC
  Filled 2014-12-11: qty 1

## 2014-12-11 MED ORDER — NAPROXEN 375 MG PO TABS: 375.0000 mg | ORAL_TABLET | Freq: Two times a day (BID) | ORAL | Status: DC

## 2014-12-11 MED ORDER — IBUPROFEN 800 MG PO TABS
800.0000 mg | ORAL_TABLET | Freq: Once | ORAL | Status: AC
  Administered 2014-12-11: 800 mg via ORAL

## 2014-12-11 NOTE — Discharge Instructions (Signed)
Urine studies normal Pregnancy test negative Back Exercises Back exercises help treat and prevent back injuries. The goal of back exercises is to increase the strength of your abdominal and back muscles and the flexibility of your back. These exercises should be started when you no longer have back pain. Back exercises include:  Pelvic Tilt. Lie on your back with your knees bent. Tilt your pelvis until the lower part of your back is against the floor. Hold this position 5 to 10 sec and repeat 5 to 10 times.  Knee to Chest. Pull first 1 knee up against your chest and hold for 20 to 30 seconds, repeat this with the other knee, and then both knees. This may be done with the other leg straight or bent, whichever feels better.  Sit-Ups or Curl-Ups. Bend your knees 90 degrees. Start with tilting your pelvis, and do a partial, slow sit-up, lifting your trunk only 30 to 45 degrees off the floor. Take at least 2 to 3 seconds for each sit-up. Do not do sit-ups with your knees out straight. If partial sit-ups are difficult, simply do the above but with only tightening your abdominal muscles and holding it as directed.  Hip-Lift. Lie on your back with your knees flexed 90 degrees. Push down with your feet and shoulders as you raise your hips a couple inches off the floor; hold for 10 seconds, repeat 5 to 10 times.  Back arches. Lie on your stomach, propping yourself up on bent elbows. Slowly press on your hands, causing an arch in your low back. Repeat 3 to 5 times. Any initial stiffness and discomfort should lessen with repetition over time.  Shoulder-Lifts. Lie face down with arms beside your body. Keep hips and torso pressed to floor as you slowly lift your head and shoulders off the floor. Do not overdo your exercises, especially in the beginning. Exercises may cause you some mild back discomfort which lasts for a few minutes; however, if the pain is more severe, or lasts for more than 15 minutes, do not  continue exercises until you see your caregiver. Improvement with exercise therapy for back problems is slow.  See your caregivers for assistance with developing a proper back exercise program. Document Released: 11/18/2004 Document Revised: 01/03/2012 Document Reviewed: 08/12/2011 [facility] Patient Information 2015 [place], [place]. This information is not intended to replace advice given to you by your health care provider. Make sure you discuss any questions you have with your health care provider.  Back Pain, Adult Low back pain is very common. About 1 in 5 people have back pain.The cause of low back pain is rarely dangerous. The pain often gets better over time.About half of people with a sudden onset of back pain feel better in just 2 weeks. About 8 in 10 people feel better by 6 weeks.  CAUSES Some common causes of back pain include:  Strain of the muscles or ligaments supporting the spine.  Wear and tear (degeneration) of the spinal discs.  Arthritis.  Direct injury to the back. DIAGNOSIS Most of the time, the direct cause of low back pain is not known.However, back pain can be treated effectively even when the exact cause of the pain is unknown.Answering your caregiver's questions about your overall health and symptoms is one of the most accurate ways to make sure the cause of your pain is not dangerous. If your caregiver needs more information, he or she may order lab work or imaging tests (X-rays or MRIs).However, even if  imaging tests show changes in your back, this usually does not require surgery. HOME CARE INSTRUCTIONS For many people, back pain returns.Since low back pain is rarely dangerous, it is often a condition that people can learn to [facility] their own.   Remain active. It is stressful on the back to sit or stand in one place. Do not sit, drive, or stand in one place for more than 30 minutes at a time. Take short walks on level surfaces as soon as pain allows.Try to  increase the length of time you walk each day.  Do not stay in bed.Resting more than 1 or 2 days can delay your recovery.  Do not avoid exercise or work.Your body is made to move.It is not dangerous to be active, even though your back may hurt.Your back will likely heal faster if you return to being active before your pain is gone.  Pay attention to your body when you bend and lift. Many people have less discomfortwhen lifting if they bend their knees, keep the load close to their bodies,and avoid twisting. Often, the most comfortable positions are those that put less stress on your recovering back.  Find a comfortable position to sleep. Use a firm mattress and lie on your side with your knees slightly bent. If you lie on your back, put a pillow under your knees.  Only take over-the-counter or prescription medicines as directed by your caregiver. Over-the-counter medicines to reduce pain and inflammation are often the most helpful.Your caregiver may prescribe muscle relaxant drugs.These medicines help dull your pain so you can more quickly return to your normal activities and healthy exercise.  Put ice on the injured area.  Put ice in a plastic bag.  Place a towel between your skin and the bag.  Leave the ice on for 15-20 minutes, 03-04 times a day for the first 2 to 3 days. After that, ice and heat may be alternated to reduce pain and spasms.  Ask your caregiver about trying back exercises and gentle massage. This may be of some benefit.  Avoid feeling anxious or stressed.Stress increases muscle tension and can worsen back pain.It is important to recognize when you are anxious or stressed and learn ways to manage it.Exercise is a great option. SEEK MEDICAL CARE IF:  You have pain that is not relieved with rest or medicine.  You have pain that does not improve in 1 week.  You have new symptoms.  You are generally not feeling well. SEEK IMMEDIATE MEDICAL CARE IF:   You  have pain that radiates from your back into your legs.  You develop new bowel or bladder control problems.  You have unusual weakness or numbness in your arms or legs.  You develop nausea or vomiting.  You develop abdominal pain.  You feel faint. Document Released: 10/11/2005 Document Revised: 04/11/2012 Document Reviewed: 02/12/2014 [facility] Patient Information 2015 [place]. This information is not intended to replace advice given to you by your health care provider. Make sure you discuss any questions you have with your health care provider.

## 2014-12-11 NOTE — ED Provider Notes (Signed)
CSN: [id number]     Arrival date & time 12/11/14  1529 History   First MD Initiated Contact with Patient 12/11/14 1551     Chief Complaint  Patient presents with  . Back Pain   (Consider location/radiation/quality/duration/timing/severity/associated sxs/prior Treatment) Patient is a 24 y.o. female presenting with back pain. The history is provided by the patient.  Back Pain Location:  Lumbar spine Quality:  Aching Radiates to:  Does not radiate Pain severity:  Mild Pain is:  Same all the time Onset quality:  Gradual Duration:  24 hours Timing:  Constant Progression:  Unchanged Chronicity:  New Context: not falling, not recent illness and not recent injury   Relieved by:  None tried Worsened by:  Bending, movement and twisting Ineffective treatments:  None tried Associated symptoms: no abdominal pain, no abdominal swelling, no bladder incontinence, no bowel incontinence, no dysuria, no fever, no pelvic pain, no perianal numbness, no tingling and no weakness     Past Medical History  Diagnosis Date  . Depression   . Headache   . Trichomonas contact    History reviewed. No pertinent past surgical history. History reviewed. No pertinent family history. History  Substance Use Topics  . Smoking status: Current Every Day Smoker -- 0.50 packs/day    Types: Cigarettes  . Smokeless tobacco: Not on file  . Alcohol Use: Yes     Comment: occa   OB History    Gravida Para Term Preterm AB TAB SAB Ectopic Multiple Living   4 1 1  2 1 1   1      Review of Systems  Constitutional: Negative for fever.  Respiratory: Negative.   Cardiovascular: Negative.   Gastrointestinal: Negative.  Negative for abdominal pain and bowel incontinence.  Genitourinary: Negative for bladder incontinence, dysuria, frequency, hematuria, flank pain, vaginal bleeding, vaginal discharge, difficulty urinating, vaginal pain and pelvic pain.  Musculoskeletal: Positive for back pain.  Neurological: Negative for  tingling and weakness.    Allergies  Review of patient's allergies indicates no known allergies.  Home Medications   Prior to Admission medications   Medication Sig Start Date End Date Taking? Authorizing Provider  acetaminophen (TYLENOL) 500 MG tablet Take 1,000 mg by mouth every 6 (six) hours as needed for moderate pain (tooth pain). For pain    Historical Provider, MD  metroNIDAZOLE (FLAGYL) 500 MG tablet Take 1 tablet (500 mg total) by mouth 2 (two) times daily. 10/14/14   Deirdre Colin Mulders Poe, CNM  naproxen (NAPROSYN) 375 MG tablet Take 1 tablet (375 mg total) by mouth 2 (two) times daily with a meal. As needed for pain 12/11/14   Mathis FareJennifer Lee H Wilfred Siverson, PA   BP 124/76 mmHg  Pulse 78  Temp(Src) 98.6 F (37 C) (Oral)  Resp 18  SpO2 100%  LMP 09/08/2014 (Approximate)  Breastfeeding? Unknown Physical Exam  Constitutional: She is oriented to person, place, and time. She appears well-developed and well-nourished.  HENT:  Head: Normocephalic and atraumatic.  Eyes: Conjunctivae are normal.  Cardiovascular: Normal rate, regular rhythm and normal heart sounds.   Pulmonary/Chest: Effort normal and breath sounds normal.  Abdominal: Soft. Normal appearance and bowel sounds are normal. She exhibits no distension. There is no tenderness. There is no CVA tenderness.  Musculoskeletal: Normal range of motion.       Lumbar back: She exhibits tenderness.       Back:  Region of discomfort with ROM of torso and palpation No rash  Neurological: She is alert and oriented to  person, place, and time. She has normal strength. No sensory deficit. Coordination and gait normal.  Reflex Scores:      Patellar reflexes are 2+ on the right side and 2+ on the left side. Skin: Skin is warm and dry.  Psychiatric: She has a normal mood and affect. Her behavior is normal.  Nursing note and vitals reviewed.   ED Course  Procedures (including critical care time) Labs Review Labs Reviewed  POCT URINALYSIS DIP  (DEVICE) - Abnormal; Notable for the following:    Bilirubin Urine SMALL (*)    Hgb urine dipstick TRACE (*)    Protein, ur 30 (*)    Leukocytes, UA TRACE (*)    All other components within normal limits  POCT PREGNANCY, URINE    Imaging Review No results found.   MDM   1. Midline low back pain without sciatica   Suspect benign lumbago. Exam benign and without neuromuscular deficit.  UA unremarkable UPT negative Advise strength and stretch exercises with NSAIDs as directed and follow up with PCP if no improvement over next 5-7 days.      Ria Clock, [place] 12/11/14 [phone]

## 2014-12-11 NOTE — ED Notes (Signed)
Pt  Reports back  Pain   Since yesterday    - pain  Worse  On movements         And  Certain       posistions        denys  Any  specefic  Injury              Pt  Also reports         Her  Period  Is  Not  Normal    States  Just  Finished   But       Wants  A  preg  Test

## 2016-03-21 ENCOUNTER — Emergency Department (HOSPITAL_COMMUNITY): Payer: Medicaid Other

## 2016-03-21 ENCOUNTER — Encounter (HOSPITAL_COMMUNITY): Payer: Self-pay | Admitting: Emergency Medicine

## 2016-03-21 ENCOUNTER — Emergency Department (HOSPITAL_COMMUNITY)
Admission: EM | Admit: 2016-03-21 | Discharge: 2016-03-22 | Discharge status: Against Medical Advice | Payer: Medicaid Other | Source: Home / Self Care | Attending: Emergency Medicine | Admitting: Emergency Medicine

## 2016-03-21 DIAGNOSIS — O99333 Smoking (tobacco) complicating pregnancy, third trimester: Secondary | ICD-10-CM

## 2016-03-21 DIAGNOSIS — O4693 Antepartum hemorrhage, unspecified, third trimester: Secondary | ICD-10-CM

## 2016-03-21 DIAGNOSIS — Z3490 Encounter for supervision of normal pregnancy, unspecified, unspecified trimester: Secondary | ICD-10-CM

## 2016-03-21 DIAGNOSIS — O99343 Other mental disorders complicating pregnancy, third trimester: Secondary | ICD-10-CM | POA: Insufficient documentation

## 2016-03-21 DIAGNOSIS — O26893 Other specified pregnancy related conditions, third trimester: Secondary | ICD-10-CM | POA: Diagnosis not present

## 2016-03-21 DIAGNOSIS — Z3A30 30 weeks gestation of pregnancy: Secondary | ICD-10-CM | POA: Insufficient documentation

## 2016-03-21 DIAGNOSIS — R109 Unspecified abdominal pain: Secondary | ICD-10-CM | POA: Diagnosis not present

## 2016-03-21 DIAGNOSIS — F17210 Nicotine dependence, cigarettes, uncomplicated: Secondary | ICD-10-CM

## 2016-03-21 DIAGNOSIS — O4692 Antepartum hemorrhage, unspecified, second trimester: Secondary | ICD-10-CM

## 2016-03-21 DIAGNOSIS — N939 Abnormal uterine and vaginal bleeding, unspecified: Secondary | ICD-10-CM

## 2016-03-21 DIAGNOSIS — F329 Major depressive disorder, single episode, unspecified: Secondary | ICD-10-CM | POA: Insufficient documentation

## 2016-03-21 LAB — BASIC METABOLIC PANEL
Anion gap: 10 (ref 5–15)
BUN: 7 mg/dL (ref 6–20)
CO2: 20 mmol/L — ABNORMAL LOW (ref 22–32)
CREATININE: 0.56 mg/dL (ref 0.44–1.00)
Calcium: 8.4 mg/dL — ABNORMAL LOW (ref 8.9–10.3)
Chloride: 109 mmol/L (ref 101–111)
Glucose, Bld: 152 mg/dL — ABNORMAL HIGH (ref 65–99)
Potassium: 3.4 mmol/L — ABNORMAL LOW (ref 3.5–5.1)
SODIUM: 139 mmol/L (ref 135–145)

## 2016-03-21 LAB — WET PREP, GENITAL
Sperm: NONE SEEN
TRICH WET PREP: NONE SEEN
YEAST WET PREP: NONE SEEN

## 2016-03-21 LAB — CBC WITH DIFFERENTIAL/PLATELET
Basophils Absolute: 0 10*3/uL (ref 0.0–0.1)
Basophils Relative: 0 %
Eosinophils Absolute: 0 10*3/uL (ref 0.0–0.7)
Eosinophils Relative: 0 %
HCT: 28.7 % — ABNORMAL LOW (ref 36.0–46.0)
HEMOGLOBIN: 9.5 g/dL — AB (ref 12.0–15.0)
LYMPHS ABS: 1.9 10*3/uL (ref 0.7–4.0)
Lymphocytes Relative: 17 %
MCH: 26.8 pg (ref 26.0–34.0)
MCHC: 33.1 g/dL (ref 30.0–36.0)
MCV: 80.8 fL (ref 78.0–100.0)
Monocytes Absolute: 0.9 10*3/uL (ref 0.1–1.0)
Monocytes Relative: 8 %
Neutro Abs: 8.5 10*3/uL — ABNORMAL HIGH (ref 1.7–7.7)
Neutrophils Relative %: 75 %
Platelets: 311 10*3/uL (ref 150–400)
RBC: 3.55 MIL/uL — ABNORMAL LOW (ref 3.87–5.11)
RDW: 13 % (ref 11.5–15.5)
WBC: 11.3 10*3/uL — ABNORMAL HIGH (ref 4.0–10.5)

## 2016-03-21 LAB — I-STAT BETA HCG BLOOD, ED (MC, WL, AP ONLY): I-stat hCG, quantitative: >2000 m[IU]/mL — ABNORMAL HIGH (ref <5)

## 2016-03-21 NOTE — ED Provider Notes (Signed)
CSN: [id number]     Arrival date & time 03/21/16  1907 History   First MD Initiated Contact with Patient 03/21/16 1932     Chief Complaint  Patient presents with  . Vaginal Bleeding   (Consider location/radiation/quality/duration/timing/severity/associated sxs/prior Treatment) HPI Comments: Patient presents with complaint of vaginal bleeding. Patient states that she had a positive pregnancy test in 09/2015. In March patient had a heavy episode of bleeding and cramping. Prior to this she felt fetal movement but none since. Patient has had monthly menstrual periods since that time. She has had occasional cramping. She is concerned that she had a miscarriage. Bleeding is not any heavier than usual. No lightheadedness or syncope. No current abdominal pain, pelvic pain or back pain. No fevers, nausea, vomiting, or diarrhea, urinary symptoms. No treatments prior to arrival. Course is constant. Nothing makes symptoms better or worse.  Patient is a 25 y.o. female presenting with vaginal bleeding. The history is provided by the patient.  Vaginal Bleeding Associated symptoms: no abdominal pain, no dysuria, no fever, no nausea and no vaginal discharge     Past Medical History  Diagnosis Date  . Depression   . Headache   . Trichomonas contact    History reviewed. No pertinent past surgical history. History reviewed. No pertinent family history. Social History  Substance Use Topics  . Smoking status: Current Every Day Smoker -- 0.50 packs/day    Types: Cigarettes  . Smokeless tobacco: None  . Alcohol Use: Yes     Comment: occa   OB History    Gravida Para Term Preterm AB TAB SAB Ectopic Multiple Living   4 1 1  2 1 1   1      Review of Systems  Constitutional: Negative for fever.  HENT: Negative for rhinorrhea and sore throat.   Eyes: Negative for redness.  Respiratory: Negative for cough.   Cardiovascular: Negative for chest pain.  Gastrointestinal: Negative for nausea, vomiting,  abdominal pain and diarrhea.  Genitourinary: Positive for vaginal bleeding. Negative for dysuria, vaginal discharge and pelvic pain.  Musculoskeletal: Negative for myalgias.  Skin: Negative for rash.  Neurological: Negative for headaches.    Allergies  Review of patient's allergies indicates no known allergies.  Home Medications   Prior to Admission medications   Medication Sig Start Date End Date Taking? Authorizing Provider  acetaminophen (TYLENOL) 500 MG tablet Take 1,000 mg by mouth every 6 (six) hours as needed for moderate pain (tooth pain). For pain    Historical Provider, MD  metroNIDAZOLE (FLAGYL) 500 MG tablet Take 1 tablet (500 mg total) by mouth 2 (two) times daily. 10/14/14   Deirdre Colin Mulders Poe, CNM  naproxen (NAPROSYN) 375 MG tablet Take 1 tablet (375 mg total) by mouth 2 (two) times daily with a meal. As needed for pain 12/11/14   Jess BartersJennifer Lee H Presson, PA   BP 121/71 mmHg  Pulse 106  Temp(Src) 98.3 F (36.8 C) (Oral)  Resp 18  SpO2 99%  LMP 12/23/2015 (Approximate)   Physical Exam  Constitutional: She appears well-developed and well-nourished.  HENT:  Head: Normocephalic and atraumatic.  Eyes: Conjunctivae are normal. Right eye exhibits no discharge. Left eye exhibits no discharge.  Neck: Normal range of motion. Neck supple.  Cardiovascular: Normal rate, regular rhythm and normal heart sounds.   Pulmonary/Chest: Effort normal and breath sounds normal.  Abdominal: Soft. There is no tenderness.  Genitourinary: There is no rash or tenderness on the right labia. There is no rash or tenderness on  the left labia. Uterus is enlarged. Cervix exhibits no motion tenderness and no discharge. Right adnexum displays no mass and no tenderness. Left adnexum displays no mass and no tenderness. No erythema or bleeding in the vagina. No foreign body around the vagina. Vaginal discharge (white) found.  Neurological: She is alert.  Skin: Skin is warm and dry.  Psychiatric: She has a  normal mood and affect.  Nursing note and vitals reviewed.   ED Course  Procedures (including critical care time) Labs Review Labs Reviewed  WET PREP, GENITAL - Abnormal; Notable for the following:    Clue Cells Wet Prep HPF POC PRESENT (*)    WBC, Wet Prep HPF POC MODERATE (*)    All other components within normal limits  CBC WITH DIFFERENTIAL/PLATELET - Abnormal; Notable for the following:    WBC 11.3 (*)    RBC 3.55 (*)    Hemoglobin 9.5 (*)    HCT 28.7 (*)    Neutro Abs 8.5 (*)    All other components within normal limits  BASIC METABOLIC PANEL - Abnormal; Notable for the following:    Potassium 3.4 (*)    CO2 20 (*)    Glucose, Bld 152 (*)    Calcium 8.4 (*)    All other components within normal limits  I-STAT BETA HCG BLOOD, ED ([facility], [facility], [facility] ONLY) - Abnormal; Notable for the following:    I-stat hCG, quantitative >2000.0 (*)    All other components within normal limits  GC/CHLAMYDIA PROBE AMP ([place]) NOT AT [facility]    Imaging Review US Ob Limited  03/21/2016  CLINICAL DATA:  Acute onset of vaginal bleeding.  Initial encounter. EXAM: LIMITED OBSTETRIC ULTRASOUND FINDINGS: Number of Fetuses:  1 Heart Rate:  147 bpm Movement:  Yes Presentation: Cephalic Placental Location: Posterior Previa: No Amniotic Fluid (Subjective):  Within normal limits. BPD:  7.5 cm   30 w 1 d MATERNAL FINDINGS: Cervix:  Appears closed. Uterus/Adnexae:  No abnormality visualized. IMPRESSION: Single live intrauterine pregnancy noted, with a biparietal diameter of 7.5 cm, corresponding to a gestational age of [redacted] weeks 1 day. This reflects an estimated date of delivery of May 29, 2016. The cervix remains closed. No evidence of placenta previa. This exam is performed on an emergent basis and does not comprehensively evaluate fetal size, dating, or anatomy; follow-up complete OB US should be considered if further fetal assessment is warranted. Electronically Signed   By: Roanna Raider M.D.   On: 03/21/2016  21:40   I have personally reviewed and evaluated these images and lab results as part of my medical decision-making.   7:52 PM Patient seen and examined. Work-up initiated.    Vital signs reviewed and are as follows: BP 121/71 mmHg  Pulse 106  Temp(Src) 98.3 F (36.8 C) (Oral)  Resp 18  SpO2 99%  LMP 12/23/2015 (Approximate)  EMERGENCY DEPARTMENT [place] PREGNANCY "Study: Limited Ultrasound of the Pelvis"  INDICATIONS:Pregnancy(required) Multiple views of the uterus and pelvic cavity are obtained with a multi-frequency probe.  APPROACH:Transabdominal   PERFORMED BY: Myself  IMAGES ARCHIVED?: No  LIMITATIONS: none  PREGNANCY FREE FLUID: None  PREGNANCY UTERUS FINDINGS:Uterus enlarged  ADNEXAL FINDINGS:Left ovary not seen and Right ovary not seen  PREGNANCY FINDINGS: Intrauterine gestational sac noted and Fetal heart activity seen  INTERPRETATION: Viable intrauterine pregnancy  GESTATIONAL AGE, ESTIMATE: not performed  FETAL HEART RATE: 136  Pt and mother updated on results.    12:17 AM Spoke earlier with Dr. Shawnie Pons to obtain  follow-up for her pregnancy. She asks that patient be placed on fetal monitor for 20 minute strip.   [facility] nurse was contacted by nursing. Strip looks okay. Will d/c to home.    Patient encouraged to go to [facility] with pelvic pain, back pain, vaginal bleeding. Patient verbalizes understanding and agrees with plan.   MDM   Final diagnoses:  Pregnancy  Vaginal bleeding in pregnancy, second trimester   Patient with new diagnosis of pregnancy. She is at 30 weeks and 1 day. Patient evaluated in emergency department. No active back pain, pelvic pain, vaginal bleeding. Spoke with OB/GYN who will arrange for follow-up. Patient placed on fetal monitor for 20 minutes. Seen briefly by rapid response nurse.    Renne Crigler, PA-C 03/22/16 4332  Nelva Nay, MD 04/02/16 [phone]

## 2016-03-21 NOTE — ED Notes (Signed)
Called [facility].  She stated she was on her way.

## 2016-03-21 NOTE — ED Notes (Signed)
Pt states that she took a positive pregnancy test in December and has had vaginal bleeding some months and none some  Months. Unsure if she is pregnant. States she has not seen an OBGYN. Alert and oriented.

## 2016-03-21 NOTE — ED Notes (Signed)
Josh, PA-C informed pt is leaving.  Mother stated "after learning she's pregnant, she doesn't want to keep it."

## 2016-03-21 NOTE — ED Notes (Signed)
[facility] arrived on unit.

## 2016-03-22 ENCOUNTER — Encounter (HOSPITAL_COMMUNITY): Payer: Self-pay | Admitting: Emergency Medicine

## 2016-03-22 ENCOUNTER — Encounter (HOSPITAL_COMMUNITY): Payer: Self-pay | Admitting: *Deleted

## 2016-03-22 ENCOUNTER — Inpatient Hospital Stay (HOSPITAL_COMMUNITY)
Admission: AD | Admit: 2016-03-22 | Discharge: 2016-03-22 | Disposition: A | Discharge status: Home / Self Care | Payer: Medicaid Other | Source: Ambulatory Visit | Attending: Family Medicine | Admitting: Family Medicine

## 2016-03-22 DIAGNOSIS — O0933 Supervision of pregnancy with insufficient antenatal care, third trimester: Secondary | ICD-10-CM

## 2016-03-22 DIAGNOSIS — R109 Unspecified abdominal pain: Secondary | ICD-10-CM | POA: Insufficient documentation

## 2016-03-22 DIAGNOSIS — Z363 Encounter for antenatal screening for malformations: Secondary | ICD-10-CM

## 2016-03-22 DIAGNOSIS — Z3A30 30 weeks gestation of pregnancy: Secondary | ICD-10-CM | POA: Insufficient documentation

## 2016-03-22 DIAGNOSIS — O99333 Smoking (tobacco) complicating pregnancy, third trimester: Secondary | ICD-10-CM | POA: Insufficient documentation

## 2016-03-22 DIAGNOSIS — F17210 Nicotine dependence, cigarettes, uncomplicated: Secondary | ICD-10-CM | POA: Insufficient documentation

## 2016-03-22 DIAGNOSIS — O26893 Other specified pregnancy related conditions, third trimester: Secondary | ICD-10-CM | POA: Insufficient documentation

## 2016-03-22 LAB — URINALYSIS, ROUTINE W REFLEX MICROSCOPIC
Glucose, UA: NEGATIVE mg/dL
Hgb urine dipstick: NEGATIVE
Ketones, ur: 15 mg/dL — AB
NITRITE: NEGATIVE
PH: 6 (ref 5.0–8.0)
Protein, ur: 30 mg/dL — AB

## 2016-03-22 LAB — URINE MICROSCOPIC-ADD ON

## 2016-03-22 MED ORDER — PRENATAL PLUS 27-1 MG PO TABS: 1.0000 | ORAL_TABLET | Freq: Every day | ORAL | Status: DC

## 2016-03-22 NOTE — ED Notes (Signed)
Josh, PA-C did not want pt d/c'd AMA, d/c papers prepared by Sharia ReeveJosh.  Brooke DareKari, Charge RN informed.

## 2016-03-22 NOTE — MAU Provider Note (Signed)
History  Chief Complaint:  Abdominal Pain  Laura Leonard is a 25 y.o. 233P1011 female at 1329w6d presenting stating she just learned she was [redacted] weeks pregnant last night at ED and was told to f/u here for 'proper u/s'.   Reports active fetal movement, contractions: none, vaginal bleeding: none currently- states she's been bleeding once monthly like period, membranes: intact. Denies uti s/s, abnormal/malodorous vag d/c or vulvovaginal itching/irritation.   Thought she had a miscarriage in Dec b/c she had heavy bleeding, then began bleeding once monthly like her period which furthered her theory of miscarriage. [facility] d/t abd tightness and vaginal bleeding. Limited ob u/s revealed cephalic fetus w/ posterior placenta w/o mention of abruption/previa, AFI subjectively wnl, BPD c/w 3872w1d, cx closed. First felt fetal movement last night in ED. States she was told to f[facility] at [facility] upon d/c last night, but had her young daughter w/ her so did not come immediately, decided to come today. Denies any current complaints. Not taking pnv. Requests pregnancy verification to take to apply for pregnancy [facility]. Requests work note for today.  Has been drinking etoh and smoking THC b/c she was unaware of pregnancy.   Obstetrical History: OB History    Gravida Para Term Preterm AB TAB SAB Ectopic Multiple Living   3 1 1  1 1  0   1      Past Medical History: Past Medical History  Diagnosis Date  . Depression   . Headache   . Trichomonas contact     Past Surgical History: Past Surgical History  Procedure Laterality Date  . No past surgeries      Social History: Social History   Social History  . Marital Status: Single    Spouse Name: N/A  . Number of Children: N/A  . Years of Education: N/A   Social History Main Topics  . Smoking status: Current Every Day Smoker -- 0.50 packs/day    Types: Cigarettes  . Smokeless tobacco: None  . Alcohol Use: Yes     Comment: 3-4  drinks a week  . Drug Use: Yes    Special: Marijuana     Comment: today Oct 14, 2014  . Sexual Activity: Yes     Comment: last sex 2-3 wks ago   Other Topics Concern  . None   Social History Narrative    Allergies: No Known Allergies  No prescriptions prior to admission    Review of Systems  Pertinent pos/neg as indicated in HPI  Physical Exam  Blood pressure 118/71, pulse 113, temperature 97.9 F (36.6 C), temperature source Oral, resp. rate 18, height 5\' 5"  (1.651 m), weight 98.431 kg (217 lb), last menstrual period 08/19/2015, unknown if currently breastfeeding. General appearance: alert, cooperative and no distress Lungs: clear to auscultation bilaterally, normal effort Heart: regular rate and rhythm Abdomen: gravid, soft, non-tender Extremities: No edema DTR's 2+  Spec exam: n/a Cultures/Specimens: n/a  Fetal monitoring: FHR: 135 bpm, variability: moderate,  Accelerations: Present,  decelerations:  Absent Uterine activity: none  MAU Course  Discussed that EDP meant to make appt w/ clinic asap for new ob visit/anatomy u/s- not to come to mau. Denies any current complaints, no current vb. No indication for u/s today, will order anatomy u/s as outpatient and get scheduled for new ob visit  Labs:  Results for orders placed or performed during the hospital encounter of 03/22/16 (from the past 24 hour(s))  Urinalysis, Routine w reflex microscopic (not at  [facility])     Status: Abnormal   Collection Time: 03/22/16  4:12 PM  Result Value Ref Range   Color, Urine YELLOW YELLOW   APPearance CLOUDY (A) CLEAR   Specific Gravity, Urine >1.030 (H) 1.005 - 1.030   pH 6.0 5.0 - 8.0   Glucose, UA NEGATIVE NEGATIVE mg/dL   Hgb urine dipstick NEGATIVE NEGATIVE   Bilirubin Urine SMALL (A) NEGATIVE   Ketones, ur 15 (A) NEGATIVE mg/dL   Protein, ur 30 (A) NEGATIVE mg/dL   Nitrite NEGATIVE NEGATIVE   Leukocytes, UA TRACE (A) NEGATIVE  Urine microscopic-add on     Status: Abnormal    Collection Time: 03/22/16  4:12 PM  Result Value Ref Range   Squamous Epithelial / LPF 6-30 (A) NONE SEEN   WBC, UA 0-5 0 - 5 WBC/hpf   RBC / HPF 0-5 0 - 5 RBC/hpf   Bacteria, UA MANY (A) NONE SEEN   Urine-Other MUCOUS PRESENT     Imaging:  None today  Assessment and Plan  A:  [redacted]w[redacted]d SIUP  G3P1011  No pnc d/t just learning she is pregnant  Cat 1 FHR P:  D/C home  Rx pnv w/ 12RF  Advised complete cessation of etoh and thc  Gave pregnancy verification letter and work note for today  Sent note to clinic w/ pt's info  Pt to call clinic tomorrow AM to get new ob visit scheduled  Ordered anatomy u/s as outpatient  Reviewed ptl s/s, fkc   Marge Duncans CNM,WHNP-BC 5/29/20174:54 PM

## 2016-03-22 NOTE — Discharge Instructions (Signed)
Please read and follow all provided instructions.  Your diagnoses today include:  1. Pregnancy   2. Vaginal bleeding   3. Vaginal bleeding in pregnancy, second trimester    Tests performed today include:  Ultrasound - shows pregnancy at approximately 30 weeks 1 day  Blood counts and electrolytes  Wet prep  Vital signs. See below for your results today.   Medications prescribed:   None  Take any prescribed medications only as directed.  Home care instructions:  Follow any educational materials contained in this packet.  Follow-up instructions: You will be contacted next week with an OB/GYN appointment.  Return instructions:   Please return to the Emergency Department if you experience worsening symptoms.   Go to the [facility] if you develop pelvic pain, back pain, or vaginal bleeding.  Please return if you have any other emergent concerns.  Additional Information:  Your vital signs today were: BP 110/56 mmHg   Pulse 84   Temp(Src) 97.8 F (36.6 C) (Oral)   Resp 20   SpO2 98%   LMP 12/23/2015 (Approximate) If your blood pressure (BP) was elevated above 135/85 this visit, please have this repeated by your doctor within one month. --------------

## 2016-03-22 NOTE — Progress Notes (Signed)
Late entry; RROB also told pt Nothing in vagina until after you are cleared/seen by OBGYN and told that it is okay to do so. Told pt that we need to know where the bleeding is coming from and could possibly be placenta previa so there is risk to her and the baby, reiterrated nothing per vagina, pt verbalized understanding

## 2016-03-22 NOTE — Discharge Instructions (Signed)
Reasons to seek care:   You begin to have strong, frequent contractions  Your water breaks.  Sometimes it is a big gush of fluid, sometimes it is just a trickle that keeps getting your panties wet or running down your legs  You have vaginal bleeding.  It is normal to have a small amount of spotting if your cervix was checked.   You don't feel your baby moving like normal.  If you don't, get you something to eat and drink and lay down and focus on feeling your baby move.  You should feel at least 10 movements in 2 hours.  If you don't, you should call the office or go to [facility].    Third Trimester of Pregnancy The third trimester is from week 29 through week 42, months 7 through 9. The third trimester is a time when the fetus is growing rapidly. At the end of the ninth month, the fetus is about 20 inches in length and weighs 6-10 pounds.  BODY CHANGES Your body goes through many changes during pregnancy. The changes vary from woman to woman.   Your weight will continue to increase. You can expect to gain 25-35 pounds (11-16 kg) by the end of the pregnancy.  You may begin to get stretch marks on your hips, abdomen, and breasts.  You may urinate more often because the fetus is moving lower into your pelvis and pressing on your bladder.  You may develop or continue to have heartburn as a result of your pregnancy.  You may develop constipation because certain hormones are causing the muscles that push waste through your intestines to slow down.  You may develop hemorrhoids or swollen, bulging veins (varicose veins).  You may have pelvic pain because of the weight gain and pregnancy hormones relaxing your joints between the bones in your pelvis. Backaches may result from overexertion of the muscles supporting your posture.  You may have changes in your hair. These can include thickening of your hair, rapid growth, and changes in texture. Some women also have hair loss during or  after pregnancy, or hair that feels dry or thin. Your hair will most likely return to normal after your baby is born.  Your breasts will continue to grow and be tender. A yellow discharge may leak from your breasts called colostrum.  Your belly button may stick out.  You may feel short of breath because of your expanding uterus.  You may notice the fetus "dropping," or moving lower in your abdomen.  You may have a bloody mucus discharge. This usually occurs a few days to a week before labor begins.  Your cervix becomes thin and soft (effaced) near your due date. WHAT TO EXPECT AT YOUR PRENATAL EXAMS  You will have prenatal exams every 2 weeks until week 36. Then, you will have weekly prenatal exams. During a routine prenatal visit:  You will be weighed to make sure you and the fetus are growing normally.  Your blood pressure is taken.  Your abdomen will be measured to track your baby's growth.  The fetal heartbeat will be listened to.  Any test results from the previous visit will be discussed.  You may have a cervical check near your due date to see if you have effaced. At around 36 weeks, your caregiver will check your cervix. At the same time, your caregiver will also perform a test on the secretions of the vaginal tissue. This test is to determine if a type  of bacteria, Group B streptococcus, is present. Your caregiver will explain this further. Your caregiver may ask you:  What your birth plan is.  How you are feeling.  If you are feeling the baby move.  If you have had any abnormal symptoms, such as leaking fluid, bleeding, severe headaches, or abdominal cramping.  If you are using any tobacco products, including cigarettes, chewing tobacco, and electronic cigarettes.  If you have any questions. Other tests or screenings that may be performed during your third trimester include:  Blood tests that check for low iron levels (anemia).  Fetal testing to check the  health, activity level, and growth of the fetus. Testing is done if you have certain medical conditions or if there are problems during the pregnancy.  HIV (human immunodeficiency virus) testing. If you are at high risk, you may be screened for HIV during your third trimester of pregnancy. FALSE LABOR You may feel small, irregular contractions that eventually go away. These are called Braxton Hicks contractions, or false labor. Contractions may last for hours, days, or even weeks before true labor sets in. If contractions come at regular intervals, intensify, or become painful, it is best to be seen by your caregiver.  SIGNS OF LABOR   Menstrual-like cramps.  Contractions that are 5 minutes apart or less.  Contractions that start on the top of the uterus and spread down to the lower abdomen and back.  A sense of increased pelvic pressure or back pain.  A watery or bloody mucus discharge that comes from the vagina. If you have any of these signs before the 37th week of pregnancy, call your caregiver right away. You need to go to the hospital to get checked immediately. HOME CARE INSTRUCTIONS   Avoid all smoking, herbs, alcohol, and unprescribed drugs. These chemicals affect the formation and growth of the baby.  Do not use any tobacco products, including cigarettes, chewing tobacco, and electronic cigarettes. If you need help quitting, ask your health care provider. You may receive counseling support and other resources to help you quit.  Follow your caregiver's instructions regarding medicine use. There are medicines that are either safe or unsafe to take during pregnancy.  Exercise only as directed by your caregiver. Experiencing uterine cramps is a good sign to stop exercising.  Continue to eat regular, healthy meals.  Wear a good support bra for breast tenderness.  Do not use hot tubs, steam rooms, or saunas.  Wear your seat belt at all times when driving.  Avoid raw meat,  uncooked cheese, cat litter boxes, and soil used by cats. These carry germs that can cause birth defects in the baby.  Take your prenatal vitamins.  Take 1500-2000 mg of calcium daily starting at the 20th week of pregnancy until you deliver your baby.  Try taking a stool softener (if your caregiver approves) if you develop constipation. Eat more high-fiber foods, such as fresh vegetables or fruit and whole grains. Drink plenty of fluids to keep your urine clear or pale yellow.  Take warm sitz baths to soothe any pain or discomfort caused by hemorrhoids. Use hemorrhoid cream if your caregiver approves.  If you develop varicose veins, wear support hose. Elevate your feet for 15 minutes, 3-4 times a day. Limit salt in your diet.  Avoid heavy lifting, wear low heal shoes, and practice good posture.  Rest a lot with your legs elevated if you have leg cramps or low back pain.  Visit your dentist if you have  not gone during your pregnancy. Use a soft toothbrush to brush your teeth and be gentle when you floss.  A sexual relationship may be continued unless your caregiver directs you otherwise.  Do not travel far distances unless it is absolutely necessary and only with the approval of your caregiver.  Take prenatal classes to understand, practice, and ask questions about the labor and delivery.  Make a trial run to the hospital.  Pack your hospital bag.  Prepare the baby's nursery.  Continue to go to all your prenatal visits as directed by your caregiver. SEEK MEDICAL CARE IF:  You are unsure if you are in labor or if your water has broken.  You have dizziness.  You have mild pelvic cramps, pelvic pressure, or nagging pain in your abdominal area.  You have persistent nausea, vomiting, or diarrhea.  You have a bad smelling vaginal discharge.  You have pain with urination. SEEK IMMEDIATE MEDICAL CARE IF:   You have a fever.  You are leaking fluid from your vagina.  You have  spotting or bleeding from your vagina.  You have severe abdominal cramping or pain.  You have rapid weight loss or gain.  You have shortness of breath with chest pain.  You notice sudden or extreme swelling of your face, hands, ankles, feet, or legs.  You have not felt your baby move in over an hour.  You have severe headaches that do not go away with medicine.  You have vision changes.   This information is not intended to replace advice given to you by your health care provider. Make sure you discuss any questions you have with your health care provider.   Document Released: 10/05/2001 Document Revised: 11/01/2014 Document Reviewed: 12/12/2012 Elsevier Interactive Patient Education [employer]2016 Elsevier Inc.

## 2016-03-22 NOTE — ED Notes (Signed)
Spoke w/ Josh, PA in reference to pt leaving AMA.  Per Sharia ReeveJosh, [place] he changed dispo to d/c d/t fear of denial of payment from insurance company if pt were to leave AMA.

## 2016-03-22 NOTE — MAU Note (Addendum)
Pt states she had abd tightness & sharp pain yesterday.  Thought she had a miscarriage in December/January.  Has been bleeding every month just like a regular period.    Was seen @ [facility] yesterday, had U/S, was told she was 30 weeks.  States she was told to come to MAU for further evaluation.  Continues to have abd tightness & pressure, denies bleeding.

## 2016-03-22 NOTE — ED Notes (Signed)
RROB was called at 2337 and asked if the [facility]hr strip [facility]; RROB said that she was never [facility] of [facility]; [facility] RN called OB attending and they said to monitor pt for 20min; ED RN was unaware that RROB had to be called to come over and monitor pt. RROB in to see pt at 2350, told that pt is signing out AMA. Pt did not realize she was still pregnant; pregnancy confirmed in Dec 2016, then pt had [facility] that [facility] was probably a false positive; pt has been having bleeding each month like a normal period, but over the past week has noticed that her abdomen is getting larger. Fhr 120, reactive and reassuring with no decels. Pt is leaving AMA; RROB [facility] [place] [facility] now; also advised to make an [facility]. The ED provider [facility] to [facility] on Tuesday to set up appt. Pt said she is not [facility]. RROB discussed PTL precautions, pt verbalized understanding; pt understands that if she has any concerns/problems then to [facility].

## 2016-03-23 LAB — GC/CHLAMYDIA PROBE AMP (~~LOC~~) NOT AT ARMC
Chlamydia: NEGATIVE
Neisseria Gonorrhea: NEGATIVE

## 2016-03-31 ENCOUNTER — Telehealth: Payer: Self-pay | Admitting: *Deleted

## 2016-03-31 DIAGNOSIS — Z36 Encounter for antenatal screening for chromosomal anomalies: Secondary | ICD-10-CM

## 2016-03-31 DIAGNOSIS — Z3490 Encounter for supervision of normal pregnancy, unspecified, unspecified trimester: Secondary | ICD-10-CM

## 2016-03-31 NOTE — Telephone Encounter (Signed)
Pt states that she was seen in [facility] and ultrasound ordered. She called to get an appointment and MFM told her that the wrong order was in. I corrected order and patient stated that she will call back to get another appointment.

## 2016-04-05 ENCOUNTER — Ambulatory Visit (INDEPENDENT_AMBULATORY_CARE_PROVIDER_SITE_OTHER): Payer: Medicaid Other | Admitting: Obstetrics & Gynecology

## 2016-04-05 ENCOUNTER — Ambulatory Visit (HOSPITAL_COMMUNITY)
Admission: RE | Admit: 2016-04-05 | Discharge: 2016-04-05 | Disposition: A | Discharge status: Home / Self Care | Payer: Medicaid Other | Source: Ambulatory Visit | Attending: Obstetrics and Gynecology | Admitting: Obstetrics and Gynecology

## 2016-04-05 ENCOUNTER — Encounter: Payer: Self-pay | Admitting: Obstetrics & Gynecology

## 2016-04-05 ENCOUNTER — Other Ambulatory Visit (HOSPITAL_COMMUNITY)
Admission: RE | Admit: 2016-04-05 | Discharge: 2016-04-05 | Disposition: A | Discharge status: Home / Self Care | Payer: Medicaid Other | Source: Ambulatory Visit | Attending: Obstetrics & Gynecology | Admitting: Obstetrics & Gynecology

## 2016-04-05 VITALS — BP 118/77 | HR 91 | Wt 213.0 lb

## 2016-04-05 DIAGNOSIS — Z3490 Encounter for supervision of normal pregnancy, unspecified, unspecified trimester: Secondary | ICD-10-CM | POA: Insufficient documentation

## 2016-04-05 DIAGNOSIS — Z113 Encounter for screening for infections with a predominantly sexual mode of transmission: Secondary | ICD-10-CM

## 2016-04-05 DIAGNOSIS — Z3493 Encounter for supervision of normal pregnancy, unspecified, third trimester: Secondary | ICD-10-CM

## 2016-04-05 DIAGNOSIS — Z36 Encounter for antenatal screening for chromosomal anomalies: Secondary | ICD-10-CM

## 2016-04-05 DIAGNOSIS — O0933 Supervision of pregnancy with insufficient antenatal care, third trimester: Secondary | ICD-10-CM | POA: Insufficient documentation

## 2016-04-05 DIAGNOSIS — Z01411 Encounter for gynecological examination (general) (routine) with abnormal findings: Secondary | ICD-10-CM | POA: Insufficient documentation

## 2016-04-05 DIAGNOSIS — Z3A32 32 weeks gestation of pregnancy: Secondary | ICD-10-CM | POA: Insufficient documentation

## 2016-04-05 DIAGNOSIS — Z124 Encounter for screening for malignant neoplasm of cervix: Secondary | ICD-10-CM

## 2016-04-05 LAB — POCT URINALYSIS DIP (DEVICE)
Glucose, UA: NEGATIVE mg/dL
HGB URINE DIPSTICK: NEGATIVE
KETONES UR: 80 mg/dL — AB
NITRITE: NEGATIVE
PH: 6.5 (ref 5.0–8.0)
Protein, ur: 100 mg/dL — AB
SPECIFIC GRAVITY, URINE: 1.025 (ref 1.005–1.030)
Urobilinogen, UA: 1 mg/dL (ref 0.0–1.0)

## 2016-04-05 LAB — OB RESULTS CONSOLE GC/CHLAMYDIA: Gonorrhea: NEGATIVE

## 2016-04-05 NOTE — Progress Notes (Addendum)
Initial labs and gtt today. Unsure if she has ever had a pap.  1hr due at 1105

## 2016-04-05 NOTE — Progress Notes (Signed)
   Subjective:    Laura Leonard is a 25 y.o. G3P1011 at 8451w6d being seen today for her first obstetrical visit.  Her obstetrical history is significant for late prenatal care, history of TSVD. Patient does not intend to breast feed. Pregnancy history fully reviewed.  Patient reports no complaints.  Filed Vitals:   04/05/16 0958  BP: 118/77  Pulse: 91  Weight: 213 lb (96.616 kg)    HISTORY: OB History  Gravida Para Term Preterm AB SAB TAB Ectopic Multiple Living  3 1 1  1  0 1   1    # Outcome Date GA Lbr Len/2nd Weight Sex Delivery Anes PTL Lv  3 Current           2 TAB           1 Term              Past Medical History  Diagnosis Date  . Depression   . Headache   . Trichomonas contact    Past Surgical History  Procedure Laterality Date  . No past surgeries     History reviewed. No pertinent family history.   Exam    Uterus:  Fundal Height: 32 cm  Pelvic Exam:    Perineum: No Hemorrhoids, Normal Perineum   Vulva: normal   Vagina:  normal mucosa, normal discharge   Cervix: multiparous appearance and Pap done   Adnexa: normal adnexa and no mass, fullness, tenderness   Bony Pelvis: average  System: Breast:  Inspection negative   Skin: normal coloration and turgor, no rashes    Neurologic: oriented, normal, negative, normal mood   Extremities: normal strength, tone, and muscle mass, no deformities   HEENT PERRLA, extra ocular movement intact and sclera clear, anicteric   Mouth/Teeth mucous membranes moist, pharynx normal without lesions and dental hygiene good   Neck supple and no masses   Cardiovascular: regular rate and rhythm   Respiratory:  appears well, vitals normal, no respiratory distress, acyanotic, normal RR, chest clear, no wheezing, crepitations, rhonchi, normal symmetric air entry   Abdomen: soft, non-tender; bowel sounds normal; no masses,  no organomegaly   Urinary: urethral meatus normal      Assessment:    Pregnancy: [id number] Patient  Active Problem List   Diagnosis Date Noted  . Supervision of normal pregnancy 04/05/2016  . Late prenatal care affecting pregnancy in third trimester 10/14/2014        Plan:     Initial/third trimester labs drawn. Prenatal vitamins. Problem list reviewed and updated. Ultrasound discussed; fetal survey: scheduled for later today. The nature [facility] with multiple MDs and other Advanced Practice Providers was explained to patient; also emphasized that residents, students are part of our team. No other complaints or concerns.  Labor and fetal movement precautions reviewed.  Follow up in 2 weeks.   Tereso NewcomerANYANWU,[name], MD 04/05/2016

## 2016-04-05 NOTE — Patient Instructions (Addendum)
Return to clinic for any scheduled appointments or obstetric concerns, or go to [facility] for evaluation  Third Trimester of Pregnancy The third trimester is from week 29 through week 42, months 7 through 9. The third trimester is a time when the fetus is growing rapidly. At the end of the ninth month, the fetus is about 20 inches in length and weighs 6-10 pounds.  BODY CHANGES Your body goes through many changes during pregnancy. The changes vary from woman to woman.   Your weight will continue to increase. You can expect to gain 25-35 pounds (11-16 kg) by the end of the pregnancy.  You may begin to get stretch marks on your hips, abdomen, and breasts.  You may urinate more often because the fetus is moving lower into your pelvis and pressing on your bladder.  You may develop or continue to have heartburn as a result of your pregnancy.  You may develop constipation because certain hormones are causing the muscles that push waste through your intestines to slow down.  You may develop hemorrhoids or swollen, bulging veins (varicose veins).  You may have pelvic pain because of the weight gain and pregnancy hormones relaxing your joints between the bones in your pelvis. Backaches may result from overexertion of the muscles supporting your posture.  You may have changes in your hair. These can include thickening of your hair, rapid growth, and changes in texture. Some women also have hair loss during or after pregnancy, or hair that feels dry or thin. Your hair will most likely return to normal after your baby is born.  Your breasts will continue to grow and be tender. A yellow discharge may leak from your breasts called colostrum.  Your belly button may stick out.  You may feel short of breath because of your expanding uterus.  You may notice the fetus "dropping," or moving lower in your abdomen.  You may have a bloody mucus discharge. This usually occurs a few days to a week before labor  begins.  Your cervix becomes thin and soft (effaced) near your due date. WHAT TO EXPECT AT YOUR PRENATAL EXAMS  You will have prenatal exams every 2 weeks until week 36. Then, you will have weekly prenatal exams. During a routine prenatal visit:  You will be weighed to make sure you and the fetus are growing normally.  Your blood pressure is taken.  Your abdomen will be measured to track your baby's growth.  The fetal heartbeat will be listened to.  Any test results from the previous visit will be discussed.  You may have a cervical check near your due date to see if you have effaced. At around 36 weeks, your caregiver will check your cervix. At the same time, your caregiver will also perform a test on the secretions of the vaginal tissue. This test is to determine if a type of bacteria, Group B streptococcus, is present. Your caregiver will explain this further. Your caregiver may ask you:  What your birth plan is.  How you are feeling.  If you are feeling the baby move.  If you have had any abnormal symptoms, such as leaking fluid, bleeding, severe headaches, or abdominal cramping.  If you are using any tobacco products, including cigarettes, chewing tobacco, and electronic cigarettes.  If you have any questions. Other tests or screenings that may be performed during your third trimester include:  Blood tests that check for low iron levels (anemia).  Fetal testing to check the health, activity   level, and growth of the fetus. Testing is done if you have certain medical conditions or if there are problems during the pregnancy.  HIV (human immunodeficiency virus) testing. If you are at high risk, you may be screened for HIV during your third trimester of pregnancy. FALSE LABOR You may feel small, irregular contractions that eventually go away. These are called Braxton Hicks contractions, or false labor. Contractions may last for hours, days, or even weeks before true labor sets  in. If contractions come at regular intervals, intensify, or become painful, it is best to be seen by your caregiver.  SIGNS OF LABOR   Menstrual-like cramps.  Contractions that are 5 minutes apart or less.  Contractions that start on the top of the uterus and spread down to the lower abdomen and back.  A sense of increased pelvic pressure or back pain.  A watery or bloody mucus discharge that comes from the vagina. If you have any of these signs before the 37th week of pregnancy, call your caregiver right away. You need to go to the hospital to get checked immediately. HOME CARE INSTRUCTIONS   Avoid all smoking, herbs, alcohol, and unprescribed drugs. These chemicals affect the formation and growth of the baby.  Do not use any tobacco products, including cigarettes, chewing tobacco, and electronic cigarettes. If you need help quitting, ask your health care provider. You may receive counseling support and other resources to help you quit.  Follow your caregiver's instructions regarding medicine use. There are medicines that are either safe or unsafe to take during pregnancy.  Exercise only as directed by your caregiver. Experiencing uterine cramps is a good sign to stop exercising.  Continue to eat regular, healthy meals.  Wear a good support bra for breast tenderness.  Do not use hot tubs, steam rooms, or saunas.  Wear your seat belt at all times when driving.  Avoid raw meat, uncooked cheese, cat litter boxes, and soil used by cats. These carry germs that can cause birth defects in the baby.  Take your prenatal vitamins.  Take 1500-2000 mg of calcium daily starting at the 20th week of pregnancy until you deliver your baby.  Try taking a stool softener (if your caregiver approves) if you develop constipation. Eat more high-fiber foods, such as fresh vegetables or fruit and whole grains. Drink plenty of fluids to keep your urine clear or pale yellow.  Take warm sitz baths to  soothe any pain or discomfort caused by hemorrhoids. Use hemorrhoid cream if your caregiver approves.  If you develop varicose veins, wear support hose. Elevate your feet for 15 minutes, 3-4 times a day. Limit salt in your diet.  Avoid heavy lifting, wear low heal shoes, and practice good posture.  Rest a lot with your legs elevated if you have leg cramps or low back pain.  Visit your dentist if you have not gone during your pregnancy. Use a soft toothbrush to brush your teeth and be gentle when you floss.  A sexual relationship may be continued unless your caregiver directs you otherwise.  Do not travel far distances unless it is absolutely necessary and only with the approval of your caregiver.  Take prenatal classes to understand, practice, and ask questions about the labor and delivery.  Make a trial run to the hospital.  Pack your hospital bag.  Prepare the baby's nursery.  Continue to go to all your prenatal visits as directed by your caregiver. SEEK MEDICAL CARE IF:  You are unsure if  you are in labor or if your water has broken.  You have dizziness.  You have mild pelvic cramps, pelvic pressure, or nagging pain in your abdominal area.  You have persistent nausea, vomiting, or diarrhea.  You have a bad smelling vaginal discharge.  You have pain with urination. SEEK IMMEDIATE MEDICAL CARE IF:   You have a fever.  You are leaking fluid from your vagina.  You have spotting or bleeding from your vagina.  You have severe abdominal cramping or pain.  You have rapid weight loss or gain.  You have shortness of breath with chest pain.  You notice sudden or extreme swelling of your face, hands, ankles, feet, or legs.  You have not felt your baby move in over an hour.  You have severe headaches that do not go away with medicine.  You have vision changes.   This information is not intended to replace advice given to you by your health care provider. Make sure you  discuss any questions you have with your health care provider.   Document Released: 10/05/2001 Document Revised: 11/01/2014 Document Reviewed: 12/12/2012 Elsevier Interactive Patient Education [employer]  Breastfeeding Deciding to breastfeed is one of the best choices you can make for you and your baby. A change in hormones during pregnancy causes your breast tissue to grow and increases the number and size of your milk ducts. These hormones also allow proteins, sugars, and fats from your blood supply to make breast milk in your milk-producing glands. Hormones prevent breast milk from being released before your baby is born as well as prompt milk flow after birth. Once breastfeeding has begun, thoughts of your baby, as well as his or her sucking or crying, can stimulate the release of milk from your milk-producing glands.  BENEFITS OF BREASTFEEDING For Your Baby  Your first milk (colostrum) helps your baby's digestive system function better.  There are antibodies in your milk that help your baby fight off infections.  Your baby has a lower incidence of asthma, allergies, and sudden infant death syndrome.  The nutrients in breast milk are better for your baby than infant formulas and are designed uniquely for your baby's needs.  Breast milk improves your baby's brain development.  Your baby is less likely to develop other conditions, such as childhood obesity, asthma, or type 2 diabetes mellitus. For You  Breastfeeding helps to create a very special bond between you and your baby.  Breastfeeding is convenient. Breast milk is always available at the correct temperature and costs nothing.  Breastfeeding helps to burn calories and helps you lose the weight gained during pregnancy.  Breastfeeding makes your uterus contract to its prepregnancy size faster and slows bleeding (lochia) after you give birth.   Breastfeeding helps to lower your risk of developing type 2 diabetes  mellitus, osteoporosis, and breast or ovarian cancer later in life. SIGNS THAT YOUR BABY IS HUNGRY Early Signs of Hunger  Increased alertness or activity.  Stretching.  Movement of the head from side to side.  Movement of the head and opening of the mouth when the corner of the mouth or cheek is stroked (rooting).  Increased sucking sounds, smacking lips, cooing, sighing, or squeaking.  Hand-to-mouth movements.  Increased sucking of fingers or hands. Late Signs of Hunger  Fussing.  Intermittent crying. Extreme Signs of Hunger Signs of extreme hunger will require calming and consoling before your baby will be able to breastfeed successfully. Do not wait for the following signs of  extreme hunger to occur before you initiate breastfeeding:  Restlessness.  A loud, strong cry.  Screaming. BREASTFEEDING BASICS Breastfeeding Initiation  Find a comfortable place to sit or lie down, with your neck and back well supported.  Place a pillow or rolled up blanket under your baby to bring him or her to the level of your breast (if you are seated). Nursing pillows are specially designed to help support your arms and your baby while you breastfeed.  Make sure that your baby's abdomen is facing your abdomen.  Gently massage your breast. With your fingertips, massage from your chest wall toward your nipple in a circular motion. This encourages milk flow. You may need to continue this action during the feeding if your milk flows slowly.  Support your breast with 4 fingers underneath and your thumb above your nipple. Make sure your fingers are well away from your nipple and your baby's mouth.  Stroke your baby's lips gently with your finger or nipple.  When your baby's mouth is open wide enough, quickly bring your baby to your breast, placing your entire nipple and as much of the colored area around your nipple (areola) as possible into your baby's mouth.  More areola should be visible  above your baby's upper lip than below the lower lip.  Your baby's tongue should be between his or her lower gum and your breast.  Ensure that your baby's mouth is correctly positioned around your nipple (latched). Your baby's lips should create a seal on your breast and be turned out (everted).  It is common for your baby to suck about 2-3 minutes in order to start the flow of breast milk. Latching Teaching your baby how to latch on to your breast properly is very important. An improper latch can cause nipple pain and decreased milk supply for you and poor weight gain in your baby. Also, if your baby is not latched onto your nipple properly, he or she may swallow some air during feeding. This can make your baby fussy. Burping your baby when you switch breasts during the feeding can help to get rid of the air. However, teaching your baby to latch on properly is still the best way to prevent fussiness from swallowing air while breastfeeding. Signs that your baby has successfully latched on to your nipple:  Silent tugging or silent sucking, without causing you pain.  Swallowing heard between every 3-4 sucks.  Muscle movement above and in front of his or her ears while sucking. Signs that your baby has not successfully latched on to nipple:  Sucking sounds or smacking sounds from your baby while breastfeeding.  Nipple pain. If you think your baby has not latched on correctly, slip your finger into the corner of your baby's mouth to break the suction and place it between your baby's gums. Attempt breastfeeding initiation again. Signs of Successful Breastfeeding Signs from your baby:  A gradual decrease in the number of sucks or complete cessation of sucking.  Falling asleep.  Relaxation of his or her body.  Retention of a small amount of milk in his or her mouth.  Letting go of your breast by himself or herself. Signs from you:  Breasts that have increased in firmness, weight, and  size 1-3 hours after feeding.  Breasts that are softer immediately after breastfeeding.  Increased milk volume, as well as a change in milk consistency and color by the fifth day of breastfeeding.  Nipples that are not sore, cracked, or bleeding. Signs  That Your Pecola Leisure is Getting Enough Milk  Wetting at least 3 diapers in a 24-hour period. The urine should be clear and pale yellow by age 150 days.  At least 3 stools in a 24-hour period by age 150 days. The stool should be soft and yellow.  At least 3 stools in a 24-hour period by age 104 days. The stool should be seedy and yellow.  No loss of weight greater than 10% of birth weight during the first 38 days of age.  Average weight gain of 4-7 ounces (113-198 g) per week after age 15 days.  Consistent daily weight gain by age 150 days, without weight loss after the age of 2 weeks. After a feeding, your baby may spit up a small amount. This is common. BREASTFEEDING FREQUENCY AND DURATION Frequent feeding will help you make more milk and can prevent sore nipples and breast engorgement. Breastfeed when you feel the need to reduce the fullness of your breasts or when your baby shows signs of hunger. This is called "breastfeeding on demand." Avoid introducing a pacifier to your baby while you are working to establish breastfeeding (the first 4-6 weeks after your baby is born). After this time you may choose to use a pacifier. Research has shown that pacifier use during the first year of a baby's life decreases the risk of sudden infant death syndrome (SIDS). Allow your baby to feed on each breast as long as he or she wants. Breastfeed until your baby is finished feeding. When your baby unlatches or falls asleep while feeding from the first breast, offer the second breast. Because newborns are often sleepy in the first few weeks of life, you may need to awaken your baby to get him or her to feed. Breastfeeding times will vary from baby to baby. However, the  following rules can serve as a guide to help you ensure that your baby is properly fed:  Newborns (babies 63 weeks of age or younger) may breastfeed every 1-3 hours.  Newborns should not go longer than 3 hours during the day or 5 hours during the night without breastfeeding.  You should breastfeed your baby a minimum of 8 times in a 24-hour period until you begin to introduce solid foods to your baby at around 35 months of age. BREAST MILK PUMPING Pumping and storing breast milk allows you to ensure that your baby is exclusively fed your breast milk, even at times when you are unable to breastfeed. This is especially important if you are going back to work while you are still breastfeeding or when you are not able to be present during feedings. Your lactation consultant can give you guidelines on how long it is safe to store breast milk. A breast pump is a machine that allows you to pump milk from your breast into a sterile bottle. The pumped breast milk can then be stored in a refrigerator or freezer. Some breast pumps are operated by hand, while others use electricity. Ask your lactation consultant which type will work best for you. Breast pumps can be purchased, but some hospitals and breastfeeding support groups lease breast pumps on a monthly basis. A lactation consultant can teach you how to hand express breast milk, if you prefer not to use a pump. CARING FOR YOUR BREASTS WHILE YOU BREASTFEED Nipples can become dry, cracked, and sore while breastfeeding. The following recommendations can help keep your breasts moisturized and healthy:  Avoid using soap on your nipples.  Wear a supportive  bra. Although not required, special nursing bras and tank tops are designed to allow access to your breasts for breastfeeding without taking off your entire bra or top. Avoid wearing underwire-style bras or extremely tight bras.  Air dry your nipples for 3-58minutes after each feeding.  Use only cotton bra  pads to absorb leaked breast milk. Leaking of breast milk between feedings is normal.  Use lanolin on your nipples after breastfeeding. Lanolin helps to maintain your skin's normal moisture barrier. If you use pure lanolin, you do not need to wash it off before feeding your baby again. Pure lanolin is not toxic to your baby. You may also hand express a few drops of breast milk and gently massage that milk into your nipples and allow the milk to air dry. In the first few weeks after giving birth, some women experience extremely full breasts (engorgement). Engorgement can make your breasts feel heavy, warm, and tender to the touch. Engorgement peaks within 3-5 days after you give birth. The following recommendations can help ease engorgement:  Completely empty your breasts while breastfeeding or pumping. You may want to start by applying warm, moist heat (in the shower or with warm water-soaked hand towels) just before feeding or pumping. This increases circulation and helps the milk flow. If your baby does not completely empty your breasts while breastfeeding, pump any extra milk after he or she is finished.  Wear a snug bra (nursing or regular) or tank top for 1-2 days to signal your body to slightly decrease milk production.  Apply ice packs to your breasts, unless this is too uncomfortable for you.  Make sure that your baby is latched on and positioned properly while breastfeeding. If engorgement persists after 48 hours of following these recommendations, contact your health care provider or a Advertising copywriter. OVERALL HEALTH CARE RECOMMENDATIONS WHILE BREASTFEEDING  Eat healthy foods. Alternate between meals and snacks, eating 3 of each per day. Because what you eat affects your breast milk, some of the foods may make your baby more irritable than usual. Avoid eating these foods if you are sure that they are negatively affecting your baby.  Drink milk, fruit juice, and water to satisfy your  thirst (about 10 glasses a day).  Rest often, relax, and continue to take your prenatal vitamins to prevent fatigue, stress, and anemia.  Continue breast self-awareness checks.  Avoid chewing and smoking tobacco. Chemicals from cigarettes that pass into breast milk and exposure to secondhand smoke may harm your baby.  Avoid alcohol and drug use, including marijuana. Some medicines that may be harmful to your baby can pass through breast milk. It is important to ask your health care provider before taking any medicine, including all over-the-counter and prescription medicine as well as vitamin and herbal supplements. It is possible to become pregnant while breastfeeding. If birth control is desired, ask your health care provider about options that will be safe for your baby. SEEK MEDICAL CARE IF:  You feel like you want to stop breastfeeding or have become frustrated with breastfeeding.  You have painful breasts or nipples.  Your nipples are cracked or bleeding.  Your breasts are red, tender, or warm.  You have a swollen area on either breast.  You have a fever or chills.  You have nausea or vomiting.  You have drainage other than breast milk from your nipples.  Your breasts do not become full before feedings by the fifth day after you give birth.  You feel sad and  depressed.  Your baby is too sleepy to eat well.  Your baby is having trouble sleeping.   Your baby is wetting less than 3 diapers in a 24-hour period.  Your baby has less than 3 stools in a 24-hour period.  Your baby's skin or the white part of his or her eyes becomes yellow.   Your baby is not gaining weight by 275 days of age. SEEK IMMEDIATE MEDICAL CARE IF:  Your baby is overly tired (lethargic) and does not want to wake up and feed.  Your baby develops an unexplained fever.   This information is not intended to replace advice given to you by your health care provider. Make sure you discuss any  questions you have with your health care provider.   Document Released: 10/11/2005 Document Revised: 07/02/2015 Document Reviewed: 04/04/2013 Elsevier Interactive Patient Education [employer] Inc[employer]

## 2016-04-06 ENCOUNTER — Telehealth: Payer: Self-pay | Admitting: General Practice

## 2016-04-06 LAB — PRENATAL PROFILE (SOLSTAS)
ANTIBODY SCREEN: NEGATIVE
BASOS PCT: 0 %
Basophils Absolute: 0 cells/uL (ref 0–200)
EOS ABS: 82 {cells}/uL (ref 15–500)
Eosinophils Relative: 1 %
HEMATOCRIT: 29.3 % — AB (ref 35.0–45.0)
HEMOGLOBIN: 9.4 g/dL — AB (ref 11.7–15.5)
HIV 1&2 Ab, 4th Generation: NONREACTIVE
Hepatitis B Surface Ag: NEGATIVE
LYMPHS PCT: 18 %
Lymphs Abs: 1476 cells/uL (ref 850–3900)
MCH: 26.3 pg — AB (ref 27.0–33.0)
MCHC: 32.1 g/dL (ref 32.0–36.0)
MCV: 82.1 fL (ref 80.0–100.0)
MONOS PCT: 7 %
MPV: 8.1 fL (ref 7.5–12.5)
Monocytes Absolute: 574 cells/uL (ref 200–950)
NEUTROS ABS: 6068 {cells}/uL (ref 1500–7800)
Neutrophils Relative %: 74 %
PLATELETS: 273 10*3/uL (ref 140–400)
RBC: 3.57 MIL/uL — AB (ref 3.80–5.10)
RDW: 13.6 % (ref 11.0–15.0)
RH TYPE: NEGATIVE
Rubella: <0.9 Index (ref <0.90)
WBC: 8.2 10*3/uL (ref 3.8–10.8)

## 2016-04-06 LAB — GC/CHLAMYDIA PROBE AMP (~~LOC~~) NOT AT ARMC
Chlamydia: NEGATIVE
Neisseria Gonorrhea: NEGATIVE

## 2016-04-06 LAB — GLUCOSE TOLERANCE, 1 HOUR (50G) W/O FASTING: Glucose, 1 Hr, gestational: 155 mg/dL — ABNORMAL HIGH (ref <140)

## 2016-04-06 LAB — CYTOLOGY - PAP

## 2016-04-06 NOTE — Telephone Encounter (Signed)
Pt has been informed of lab results. 

## 2016-04-06 NOTE — Telephone Encounter (Signed)
Patient failed 1 hr gtt and needs 3 hr gtt. Called patient, no answer- left message to call us back at the clinics for results.

## 2016-04-06 NOTE — Telephone Encounter (Signed)
Second attempt to reach patient concerning lab results.

## 2016-04-07 LAB — CULTURE, OB URINE: Colony Count: >=100000

## 2016-04-08 ENCOUNTER — Encounter: Payer: Self-pay | Admitting: Obstetrics & Gynecology

## 2016-04-08 DIAGNOSIS — R87612 Low grade squamous intraepithelial lesion on cytologic smear of cervix (LGSIL): Secondary | ICD-10-CM | POA: Insufficient documentation

## 2016-04-08 DIAGNOSIS — Z283 Underimmunization status: Secondary | ICD-10-CM | POA: Insufficient documentation

## 2016-04-08 DIAGNOSIS — O09899 Supervision of other high risk pregnancies, unspecified trimester: Secondary | ICD-10-CM | POA: Insufficient documentation

## 2016-04-08 DIAGNOSIS — Z6791 Unspecified blood type, Rh negative: Secondary | ICD-10-CM | POA: Insufficient documentation

## 2016-04-08 DIAGNOSIS — O26899 Other specified pregnancy related conditions, unspecified trimester: Secondary | ICD-10-CM

## 2016-04-08 DIAGNOSIS — O9989 Other specified diseases and conditions complicating pregnancy, childbirth and the puerperium: Secondary | ICD-10-CM

## 2016-04-12 ENCOUNTER — Other Ambulatory Visit: Payer: Medicaid Other

## 2016-04-19 ENCOUNTER — Encounter: Payer: Self-pay | Admitting: Obstetrics and Gynecology

## 2016-05-25 ENCOUNTER — Inpatient Hospital Stay (HOSPITAL_COMMUNITY)
Admission: AD | Admit: 2016-05-25 | Discharge: 2016-05-28 | DRG: 766 | Disposition: A | Discharge status: Home / Self Care | Payer: Medicaid Other | Source: Ambulatory Visit | Attending: Obstetrics and Gynecology | Admitting: Obstetrics and Gynecology

## 2016-05-25 ENCOUNTER — Encounter (HOSPITAL_COMMUNITY): Payer: Self-pay | Admitting: *Deleted

## 2016-05-25 DIAGNOSIS — O0933 Supervision of pregnancy with insufficient antenatal care, third trimester: Secondary | ICD-10-CM

## 2016-05-25 DIAGNOSIS — O9902 Anemia complicating childbirth: Secondary | ICD-10-CM | POA: Diagnosis present

## 2016-05-25 DIAGNOSIS — Z87891 Personal history of nicotine dependence: Secondary | ICD-10-CM

## 2016-05-25 DIAGNOSIS — Z3A40 40 weeks gestation of pregnancy: Secondary | ICD-10-CM | POA: Diagnosis not present

## 2016-05-25 DIAGNOSIS — O360930 Maternal care for other rhesus isoimmunization, third trimester, not applicable or unspecified: Secondary | ICD-10-CM | POA: Diagnosis present

## 2016-05-25 DIAGNOSIS — Z789 Other specified health status: Secondary | ICD-10-CM | POA: Diagnosis not present

## 2016-05-25 DIAGNOSIS — Z2839 Other underimmunization status: Secondary | ICD-10-CM

## 2016-05-25 DIAGNOSIS — O24420 Gestational diabetes mellitus in childbirth, diet controlled: Secondary | ICD-10-CM | POA: Diagnosis present

## 2016-05-25 DIAGNOSIS — O134 Gestational [pregnancy-induced] hypertension without significant proteinuria, complicating childbirth: Secondary | ICD-10-CM | POA: Diagnosis present

## 2016-05-25 DIAGNOSIS — O9989 Other specified diseases and conditions complicating pregnancy, childbirth and the puerperium: Secondary | ICD-10-CM

## 2016-05-25 DIAGNOSIS — O133 Gestational [pregnancy-induced] hypertension without significant proteinuria, third trimester: Secondary | ICD-10-CM | POA: Diagnosis present

## 2016-05-25 DIAGNOSIS — Z23 Encounter for immunization: Secondary | ICD-10-CM | POA: Diagnosis not present

## 2016-05-25 DIAGNOSIS — Z283 Underimmunization status: Secondary | ICD-10-CM

## 2016-05-25 HISTORY — DX: Essential (primary) hypertension: I10

## 2016-05-25 LAB — COMPREHENSIVE METABOLIC PANEL
ALK PHOS: 112 U/L (ref 38–126)
ALT: 9 U/L — ABNORMAL LOW (ref 14–54)
ANION GAP: 5 (ref 5–15)
AST: 13 U/L — ABNORMAL LOW (ref 15–41)
Albumin: 2.7 g/dL — ABNORMAL LOW (ref 3.5–5.0)
BILIRUBIN TOTAL: 0.2 mg/dL — AB (ref 0.3–1.2)
BUN: 8 mg/dL (ref 6–20)
CALCIUM: 8 mg/dL — AB (ref 8.9–10.3)
CO2: 20 mmol/L — ABNORMAL LOW (ref 22–32)
Chloride: 108 mmol/L (ref 101–111)
Creatinine, Ser: 0.48 mg/dL (ref 0.44–1.00)
GFR calc non Af Amer: >60 mL/min (ref >60)
Glucose, Bld: 85 mg/dL (ref 65–99)
POTASSIUM: 3.8 mmol/L (ref 3.5–5.1)
Sodium: 133 mmol/L — ABNORMAL LOW (ref 135–145)
TOTAL PROTEIN: 5.8 g/dL — AB (ref 6.5–8.1)

## 2016-05-25 LAB — CBC
HCT: 26.5 % — ABNORMAL LOW (ref 36.0–46.0)
HEMATOCRIT: 26.8 % — AB (ref 36.0–46.0)
HEMOGLOBIN: 8.7 g/dL — AB (ref 12.0–15.0)
Hemoglobin: 8.7 g/dL — ABNORMAL LOW (ref 12.0–15.0)
MCH: 24.6 pg — AB (ref 26.0–34.0)
MCH: 24.7 pg — AB (ref 26.0–34.0)
MCHC: 32.5 g/dL (ref 30.0–36.0)
MCHC: 32.8 g/dL (ref 30.0–36.0)
MCV: 75.9 fL — ABNORMAL LOW (ref 78.0–100.0)
MCV: 75.3 fL — AB (ref 78.0–100.0)
PLATELETS: 306 10*3/uL (ref 150–400)
Platelets: 273 10*3/uL (ref 150–400)
RBC: 3.53 MIL/uL — ABNORMAL LOW (ref 3.87–5.11)
RBC: 3.52 MIL/uL — ABNORMAL LOW (ref 3.87–5.11)
RDW: 14.5 % (ref 11.5–15.5)
RDW: 14.7 % (ref 11.5–15.5)
WBC: 9.9 10*3/uL (ref 4.0–10.5)
WBC: 10.6 10*3/uL — ABNORMAL HIGH (ref 4.0–10.5)

## 2016-05-25 LAB — RAPID URINE DRUG SCREEN, HOSP PERFORMED
Amphetamines: NOT DETECTED
BARBITURATES: NOT DETECTED
Benzodiazepines: NOT DETECTED
Cocaine: NOT DETECTED
Opiates: NOT DETECTED
Tetrahydrocannabinol: POSITIVE — AB

## 2016-05-25 LAB — TYPE AND SCREEN
ABO/RH(D): B NEG
Antibody Screen: NEGATIVE

## 2016-05-25 LAB — PROTEIN / CREATININE RATIO, URINE
CREATININE, URINE: 225 mg/dL
Protein Creatinine Ratio: 0.18 mg/mg{Cre} — ABNORMAL HIGH (ref 0.00–0.15)
TOTAL PROTEIN, URINE: 41 mg/dL

## 2016-05-25 LAB — GROUP B STREP BY PCR: GROUP B STREP BY PCR: NEGATIVE

## 2016-05-25 LAB — URIC ACID: URIC ACID, SERUM: 2.9 mg/dL (ref 2.3–6.6)

## 2016-05-25 LAB — OB RESULTS CONSOLE GBS: GBS: NEGATIVE

## 2016-05-25 MED ORDER — OXYTOCIN BOLUS FROM INFUSION
500.0000 mL | Freq: Once | INTRAVENOUS | Status: DC
Start: 2016-05-25 — End: 2016-05-27

## 2016-05-25 MED ORDER — LACTATED RINGERS IV SOLN
INTRAVENOUS | Status: DC
  Administered 2016-05-26: 10:00:00 via INTRAVENOUS

## 2016-05-25 MED ORDER — ACETAMINOPHEN 325 MG PO TABS: 650.0000 mg | ORAL_TABLET | ORAL | Status: DC | PRN

## 2016-05-25 MED ORDER — OXYCODONE-ACETAMINOPHEN 5-325 MG PO TABS: 2.0000 | ORAL_TABLET | ORAL | Status: DC | PRN

## 2016-05-25 MED ORDER — OXYCODONE-ACETAMINOPHEN 5-325 MG PO TABS: 1.0000 | ORAL_TABLET | ORAL | Status: DC | PRN

## 2016-05-25 MED ORDER — SOD CITRATE-CITRIC ACID 500-334 MG/5ML PO SOLN
30.0000 mL | ORAL | Status: DC | PRN
  Administered 2016-05-26: 30 mL via ORAL
  Filled 2016-05-25 (×2): qty 15

## 2016-05-25 MED ORDER — FENTANYL CITRATE (PF) 100 MCG/2ML IJ SOLN
INTRAMUSCULAR | Status: AC
  Administered 2016-05-26: 100 ug via INTRAVENOUS
  Filled 2016-05-25: qty 2

## 2016-05-25 MED ORDER — OXYTOCIN 40 UNITS IN LACTATED RINGERS INFUSION - SIMPLE MED
2.5000 [IU]/h | INTRAVENOUS | Status: DC
  Filled 2016-05-25: qty 1000

## 2016-05-25 MED ORDER — LACTATED RINGERS IV SOLN
INTRAVENOUS | Status: DC
  Administered 2016-05-25 – 2016-05-26 (×4): via INTRAVENOUS

## 2016-05-25 MED ORDER — MISOPROSTOL 50MCG HALF TABLET: 50.0000 ug | ORAL_TABLET | ORAL | Status: DC

## 2016-05-25 MED ORDER — LIDOCAINE HCL (PF) 1 % IJ SOLN
30.0000 mL | INTRAMUSCULAR | Status: DC | PRN
  Filled 2016-05-25: qty 30

## 2016-05-25 MED ORDER — LACTATED RINGERS IV SOLN
500.0000 mL | INTRAVENOUS | Status: DC | PRN
  Administered 2016-05-26: 500 mL via INTRAVENOUS

## 2016-05-25 MED ORDER — FENTANYL CITRATE (PF) 100 MCG/2ML IJ SOLN
100.0000 ug | INTRAMUSCULAR | Status: DC | PRN
  Administered 2016-05-25 – 2016-05-26 (×3): 100 ug via INTRAVENOUS
  Filled 2016-05-25 (×2): qty 2

## 2016-05-25 MED ORDER — ONDANSETRON HCL 4 MG/2ML IJ SOLN
4.0000 mg | Freq: Four times a day (QID) | INTRAMUSCULAR | Status: DC | PRN
  Administered 2016-05-26 (×2): 4 mg via INTRAVENOUS
  Filled 2016-05-25: qty 2

## 2016-05-25 MED ORDER — MISOPROSTOL 25 MCG QUARTER TABLET
25.0000 ug | ORAL_TABLET | ORAL | Status: DC
  Administered 2016-05-25 – 2016-05-26 (×4): 25 ug via VAGINAL
  Filled 2016-05-25 (×4): qty 0.25

## 2016-05-25 NOTE — Progress Notes (Signed)
Laura Leonard is a 25 y.o. G3P1011 at [redacted]w[redacted]d by ultrasound admitted for IOL for gHTN.  Objective: BP 133/77   Pulse 69   Temp 97.8 F (36.6 C) (Oral)   Resp 18   Ht 5\' 5"  (1.651 m)   Wt 209 lb (94.8 kg)   LMP 08/19/2015   SpO2 100%   BMI 34.78 kg/m  No intake/output data recorded. No intake/output data recorded.  FHT:   Fetal Heart Rate A  Mode External filed at 05/25/2016 2001  Baseline Rate (A) 130 bpm [intermittent tracing ] filed at 05/25/2016 2101  Variability 6-25 BPM filed at 05/25/2016 2101  Accelerations 15 x 15 filed at 05/25/2016 2101  Decelerations Variable filed at 05/25/2016 2101    UC:   Mild 60-80 sec ctxs, every 2-5 min SVE:   Dilation: 2 Effacement (%): 50 Station: -3, -2 Exam by:: Brein, MD  Labs: Lab Results  Component Value Date   WBC 10.6 (H) 05/25/2016   HGB 8.7 (L) 05/25/2016   HCT 26.5 (L) 05/25/2016   MCV 75.3 (L) 05/25/2016   PLT 306 05/25/2016    Assessment / Plan: Laura Leonard is a 25 y.o. G3P1011 at [redacted]w[redacted]d here for IOL for gHTN.  Labor: Foley placed at 21:15, 60ccs of saline filled bulb, traction applied and foley confirmed, Cytotec q 4 hrs, last dose at 19:02 (~2.5 hours ago) Preeclampsia:  no signs or symptoms of toxicity Fetal Wellbeing:  Category II, variable decel Pain Control:  Pt desires Epidural I/D: GBS neg Anticipated MOD:  NSVD  Andres Ege, MD, PGY-1, MPH 05/25/2016, 9:24 PM   OB FELLOW LABOR PROGRESS NOTE ATTESTATION  I have seen and examined this patient and agree with above documentation in the resident's note.   Jen Mow, DO  OB Fellow 7:18 AM

## 2016-05-25 NOTE — H&P (Signed)
LABOR AND DELIVERY ADMISSION HISTORY AND PHYSICAL NOTE  Laura Leonard is a 25 y.o. female G3P1011 with IUP at [redacted]w[redacted]d by 3rd trimester [place] presenting for IOL for gHTN. She has had very limited prenatal care for this pregnancy. She had a positive UPT in December, but had regular bleeding and thought that she had had an early miscarriage. She states that she drank and smoked as if she was not pregnant until 03/15/16 when she was seen in the MAU and found to be [redacted] weeks pregnant. She had one clinic appointment, but did not return for followup 3hr GTT. Of note, she is B negative, FOB blood type is unknown and she has not received Rhogam for any of this pregnancy.  She reports positive fetal movement. She denies leakage of fluid or vaginal bleeding.  Prenatal History/Complications:  Past Medical History: Past Medical History:  Diagnosis Date  . Depression   . Headache   . Trichomonas contact     Past Surgical History: Past Surgical History:  Procedure Laterality Date  . NO PAST SURGERIES      Obstetrical History: OB History    Gravida Para Term Preterm AB Living   SAB TAB Ectopic Multiple Live Births   0 1            Social History: Social History   Social History  . Marital status: Single    Spouse name: N/A  . Number of children: N/A  . Years of education: N/A   Social History Main Topics  . Smoking status: Former Smoker    Packs/day: 0.50    Types: Cigarettes    Quit date: 04/24/2016  . Smokeless tobacco: Never Used  . Alcohol use Yes     Comment: 3-4 drinks a week  . Drug use:     Types: Marijuana     Comment: today Oct 14, 2014  . Sexual activity: Yes     Comment: last sex 2-3 wks ago   Other Topics Concern  . None   Social History Narrative  . None    Family History: History reviewed. No pertinent family history.  Allergies: No Known Allergies  Prescriptions Prior to Admission  Medication Sig Dispense Refill Last Dose  . prenatal  vitamin w/FE, FA (PRENATAL 1 + 1) 27-1 MG TABS tablet Take 1 tablet by mouth daily at 12 noon. 30 each 12 Past Month at Unknown time     Review of Systems   All systems reviewed and negative except as stated in HPI  Blood pressure 127/80, pulse 84, temperature 97.2 F (36.2 C), resp. rate 18, height  (1.651 m), weight 94.8 kg (209 lb), last menstrual period 08/19/2015, SpO2 100 %, unknown if currently breastfeeding. General appearance: alert, cooperative and no distress Lungs: clear to auscultation bilaterally Heart: regular rate and rhythm Abdomen: soft, non-tender; bowel sounds normal Extremities: No calf swelling or tenderness Presentation: cephalic Fetal monitoring: cat 1 Uterine activity: irregular contractions Dilation: Fingertip Effacement (%): Thick Station: -3 Exam by:: Leafy Ro, RNC   Prenatal labs: ABO, Rh: B/NEG/-- (06/12 1112) Antibody: NEG (06/12 1112) Rubella: !Error! RPR: NON REAC (06/12 1112)  HBsAg: NEGATIVE (06/12 1112)  HIV: NONREACTIVE (06/12 1112)  GBS:    1 hr Glucola: failed, did not RTC for 3hr GTT Genetic screening:  Too late Anatomy US: 3rd trimester, normal  Prenatal Transfer Tool  Maternal Diabetes: Yes:  Diabetes Type:  Diet controlled - failed 1hr, did  not do 3hr GTT Genetic Screening: Declined Maternal Ultrasounds/Referrals: Normal Fetal Ultrasounds or other Referrals:  None Maternal Substance Abuse:  Yes:  Type: Marijuana Significant Maternal Medications:  None Significant Maternal Lab Results: Lab values include: Other: GBS PCR pending  Results for orders placed or performed during the hospital encounter of 05/25/16 (from the past 24 hour(s))  Protein / creatinine ratio, urine   Collection Time: 05/25/16  1:05 PM  Result Value Ref Range   Creatinine, Urine 225.00 mg/dL   Total Protein, Urine 41 mg/dL   Protein Creatinine Ratio 0.18 (H) 0.00 - 0.15 mg/mg[Cre]  Urine rapid drug screen (hosp performed)   Collection Time:  05/25/16  1:05 PM  Result Value Ref Range   Opiates NONE DETECTED NONE DETECTED   Cocaine NONE DETECTED NONE DETECTED   Benzodiazepines NONE DETECTED NONE DETECTED   Amphetamines NONE DETECTED NONE DETECTED   Tetrahydrocannabinol POSITIVE (A) NONE DETECTED   Barbiturates NONE DETECTED NONE DETECTED  CBC   Collection Time: 05/25/16  2:13 PM  Result Value Ref Range   WBC 9.9 4.0 - 10.5 K/uL   RBC 3.53 (L) 3.87 - 5.11 MIL/uL   Hemoglobin 8.7 (L) 12.0 - 15.0 g/dL   HCT 24.4 (L) 01.0 - 27.2 %   MCV 75.9 (L) 78.0 - 100.0 fL   MCH 24.6 (L) 26.0 - 34.0 pg   MCHC 32.5 30.0 - 36.0 g/dL   RDW 53.6 64.4 - 03.4 %   Platelets 273 150 - 400 K/uL  Comprehensive metabolic panel   Collection Time: 05/25/16  2:13 PM  Result Value Ref Range   Sodium 133 (L) 135 - 145 mmol/L   Potassium 3.8 3.5 - 5.1 mmol/L   Chloride 108 101 - 111 mmol/L   CO2 20 (L) 22 - 32 mmol/L   Glucose, Bld 85 65 - 99 mg/dL   BUN 8 6 - 20 mg/dL   Creatinine, Ser 7.42 0.44 - 1.00 mg/dL   Calcium 8.0 (L) 8.9 - 10.3 mg/dL   Total Protein 5.8 (L) 6.5 - 8.1 g/dL   Albumin 2.7 (L) 3.5 - 5.0 g/dL   AST 13 (L) 15 - 41 U/L   ALT 9 (L) 14 - 54 U/L   Alkaline Phosphatase 112 38 - 126 U/L   Total Bilirubin 0.2 (L) 0.3 - 1.2 mg/dL   GFR calc non Af Amer >60 >60 mL/min   GFR calc Af Amer >60 >60 mL/min   Anion gap 5 5 - 15  Uric acid   Collection Time: 05/25/16  2:13 PM  Result Value Ref Range   Uric Acid, Serum 2.9 2.3 - 6.6 mg/dL    Patient Active Problem List   Diagnosis Date Noted  . Low grade squamous intraepithelial lesion (LGSIL) on cervical Pap smear, cannot rule out high grade cells on 04/06/16 04/08/2016  . Rh negative, antepartum 04/08/2016  . Rubella non-immune status, antepartum 04/08/2016  . Supervision of normal pregnancy 04/05/2016  . Late prenatal care affecting pregnancy in third trimester 10/14/2014    Assessment: Laura Leonard is a 25 y.o. G3P1011 at [redacted]w[redacted]d here for IOL for gHTN  #Labor:  cytotec #Pain: Desires epidural #FWB: Cat 1 #ID:  GBS unk - PCR pending #MOF: breast #MOC: undecided  Loni Muse 05/25/2016, 4:54 PM   OB FELLOW HISTORY AND PHYSICAL ATTESTATION  I have seen and examined this patient; I agree with above documentation in the resident's note.    Jen Mow, DO OB Fellow 05/25/2016, 5:00 PM

## 2016-05-25 NOTE — Anesthesia Pain Management Evaluation Note (Signed)
  CRNA Pain Management Visit Note  Patient: Laura Leonard, 25 y.o., female  "Hello I am a member of the anesthesia team at [facility]. We have an anesthesia team available at all times to provide care throughout the hospital, including epidural management and anesthesia for C-section. I don't know your plan for the delivery whether it a natural birth, water birth, IV sedation, nitrous supplementation, doula or epidural, but we want to meet your pain goals."   1.Was your pain managed to your expectations on prior hospitalizations?   Yes   2.What is your expectation for pain management during this hospitalization?     Epidural  3.How can we help you reach that goal? epidural  Record the patient's initial score and the patient's pain goal.   Pain: 3  Pain Goal: 6 [facility] wants you to be able to say your pain was always managed very well.  Rahil Passey 05/25/2016

## 2016-05-25 NOTE — Progress Notes (Signed)
Report to Raney on BS.  Patient to be admitted to room 167.

## 2016-05-25 NOTE — MAU Note (Signed)
Urine in lab 

## 2016-05-25 NOTE — MAU Note (Signed)
Patient presents with back pain since Sunday.  Not sure if contractions.  Also feeling some abdominal pain. Denies LOF or vaginal bleeding.  Reports +fetal movement.  States she learned she was pregnant at 8 months and has had late prenatal care.

## 2016-05-26 ENCOUNTER — Inpatient Hospital Stay (HOSPITAL_COMMUNITY): Payer: Medicaid Other | Admitting: Anesthesiology

## 2016-05-26 ENCOUNTER — Encounter (HOSPITAL_COMMUNITY)
Admission: AD | Disposition: A | Discharge status: Home / Self Care | Payer: Self-pay | Source: Ambulatory Visit | Attending: Obstetrics and Gynecology

## 2016-05-26 DIAGNOSIS — O134 Gestational [pregnancy-induced] hypertension without significant proteinuria, complicating childbirth: Secondary | ICD-10-CM

## 2016-05-26 DIAGNOSIS — Z3A40 40 weeks gestation of pregnancy: Secondary | ICD-10-CM

## 2016-05-26 LAB — CBC
HCT: 26.7 % — ABNORMAL LOW (ref 36.0–46.0)
Hemoglobin: 8.9 g/dL — ABNORMAL LOW (ref 12.0–15.0)
MCH: 24.7 pg — AB (ref 26.0–34.0)
MCHC: 33.3 g/dL (ref 30.0–36.0)
MCV: 74.2 fL — AB (ref 78.0–100.0)
PLATELETS: 260 10*3/uL (ref 150–400)
RBC: 3.6 MIL/uL — ABNORMAL LOW (ref 3.87–5.11)
RDW: 14.7 % (ref 11.5–15.5)
WBC: 11.3 10*3/uL — AB (ref 4.0–10.5)

## 2016-05-26 LAB — RPR: RPR Ser Ql: NONREACTIVE

## 2016-05-26 SURGERY — Surgical Case
Anesthesia: Regional

## 2016-05-26 MED ORDER — LACTATED RINGERS IV SOLN: 500.0000 mL | Freq: Once | INTRAVENOUS | Status: DC

## 2016-05-26 MED ORDER — LIDOCAINE-EPINEPHRINE (PF) 2 %-1:200000 IJ SOLN
INTRAMUSCULAR | Status: AC
  Filled 2016-05-26: qty 20

## 2016-05-26 MED ORDER — PHENYLEPHRINE 40 MCG/ML (10ML) SYRINGE FOR IV PUSH (FOR BLOOD PRESSURE SUPPORT)
80.0000 ug | PREFILLED_SYRINGE | INTRAVENOUS | Status: DC | PRN
  Filled 2016-05-26: qty 10

## 2016-05-26 MED ORDER — SODIUM CHLORIDE 0.9 % IR SOLN
Status: DC | PRN
  Administered 2016-05-26: 1

## 2016-05-26 MED ORDER — OXYTOCIN 40 UNITS IN LACTATED RINGERS INFUSION - SIMPLE MED: 1.0000 m[IU]/min | INTRAVENOUS | Status: DC

## 2016-05-26 MED ORDER — LIDOCAINE HCL (PF) 1 % IJ SOLN
INTRAMUSCULAR | Status: DC | PRN
  Administered 2016-05-26: 3 mL via EPIDURAL
  Administered 2016-05-26: 5 mL via EPIDURAL
  Administered 2016-05-26: 2 mL via EPIDURAL

## 2016-05-26 MED ORDER — OXYTOCIN 10 UNIT/ML IJ SOLN
INTRAMUSCULAR | Status: AC
  Filled 2016-05-26: qty 4

## 2016-05-26 MED ORDER — TERBUTALINE SULFATE 1 MG/ML IJ SOLN
INTRAMUSCULAR | Status: AC
  Administered 2016-05-26: 1 mg
  Filled 2016-05-26: qty 1

## 2016-05-26 MED ORDER — OXYCODONE HCL 5 MG PO TABS: 5.0000 mg | ORAL_TABLET | Freq: Once | ORAL | Status: DC | PRN

## 2016-05-26 MED ORDER — ONDANSETRON HCL 4 MG/2ML IJ SOLN: 4.0000 mg | Freq: Once | INTRAMUSCULAR | Status: DC | PRN

## 2016-05-26 MED ORDER — EPHEDRINE 5 MG/ML INJ: 10.0000 mg | INTRAVENOUS | Status: DC | PRN

## 2016-05-26 MED ORDER — TERBUTALINE SULFATE 1 MG/ML IJ SOLN: 0.2500 mg | Freq: Once | INTRAMUSCULAR | Status: DC | PRN

## 2016-05-26 MED ORDER — MEPERIDINE HCL 25 MG/ML IJ SOLN
INTRAMUSCULAR | Status: DC | PRN
  Administered 2016-05-26: 12.5 mg via INTRAVENOUS

## 2016-05-26 MED ORDER — SCOPOLAMINE 1 MG/3DAYS TD PT72
MEDICATED_PATCH | TRANSDERMAL | Status: AC
  Filled 2016-05-26: qty 1

## 2016-05-26 MED ORDER — MORPHINE SULFATE (PF) 0.5 MG/ML IJ SOLN
INTRAMUSCULAR | Status: DC | PRN
  Administered 2016-05-26: 1 mg via INTRAVENOUS
  Administered 2016-05-26: 4 mg via EPIDURAL

## 2016-05-26 MED ORDER — CEFAZOLIN IN D5W 1 GM/50ML IV SOLN
INTRAVENOUS | Status: DC | PRN
  Administered 2016-05-26: 2 g via INTRAVENOUS

## 2016-05-26 MED ORDER — LACTATED RINGERS IV SOLN: INTRAVENOUS | Status: DC

## 2016-05-26 MED ORDER — DEXAMETHASONE SODIUM PHOSPHATE 4 MG/ML IJ SOLN
INTRAMUSCULAR | Status: AC
  Filled 2016-05-26: qty 1

## 2016-05-26 MED ORDER — HYDROMORPHONE HCL 1 MG/ML IJ SOLN: 0.2500 mg | INTRAMUSCULAR | Status: DC | PRN

## 2016-05-26 MED ORDER — MEPERIDINE HCL 25 MG/ML IJ SOLN: 6.2500 mg | INTRAMUSCULAR | Status: DC | PRN

## 2016-05-26 MED ORDER — LACTATED RINGERS IV SOLN
INTRAVENOUS | Status: DC
  Administered 2016-05-26: 19:00:00 via INTRAUTERINE

## 2016-05-26 MED ORDER — MEPERIDINE HCL 25 MG/ML IJ SOLN
INTRAMUSCULAR | Status: AC
  Filled 2016-05-26: qty 1

## 2016-05-26 MED ORDER — SCOPOLAMINE 1 MG/3DAYS TD PT72SCOPOLAMINE 1 MG/3DAYS
MEDICATED_PATCH | TRANSDERMAL | Status: DC | PRN
Start: 2016-05-26 — End: 2016-05-26
  Administered 2016-05-26: 1 via TRANSDERMAL

## 2016-05-26 MED ORDER — PHENYLEPHRINE HCL 10 MG/ML IJ SOLN
INTRAMUSCULAR | Status: DC | PRN
  Administered 2016-05-26: 80 ug via INTRAVENOUS
  Administered 2016-05-26: 40 ug via INTRAVENOUS

## 2016-05-26 MED ORDER — LIDOCAINE-EPINEPHRINE (PF) 2 %-1:200000 IJ SOLN
INTRAMUSCULAR | Status: DC | PRN
  Administered 2016-05-26 (×3): 5 mL via INTRADERMAL

## 2016-05-26 MED ORDER — OXYTOCIN 10 UNIT/ML IJ SOLN
INTRAVENOUS | Status: DC | PRN
  Administered 2016-05-26: 40 [IU] via INTRAVENOUS

## 2016-05-26 MED ORDER — DEXAMETHASONE SODIUM PHOSPHATE 4 MG/ML IJ SOLN
INTRAMUSCULAR | Status: DC | PRN
  Administered 2016-05-26: 4 mg via INTRAVENOUS

## 2016-05-26 MED ORDER — MORPHINE SULFATE (PF) 0.5 MG/ML IJ SOLN
INTRAMUSCULAR | Status: AC
  Filled 2016-05-26: qty 10

## 2016-05-26 MED ORDER — FENTANYL 2.5 MCG/ML BUPIVACAINE 1/10 % EPIDURAL INFUSION (WH - ANES)
14.0000 mL/h | INTRAMUSCULAR | Status: DC | PRN
  Administered 2016-05-26 (×4): 14 mL/h via EPIDURAL
  Filled 2016-05-26 (×3): qty 125

## 2016-05-26 MED ORDER — DIPHENHYDRAMINE HCL 50 MG/ML IJ SOLN: 12.5000 mg | INTRAMUSCULAR | Status: DC | PRN

## 2016-05-26 MED ORDER — OXYTOCIN 40 UNITS IN LACTATED RINGERS INFUSION - SIMPLE MED
1.0000 m[IU]/min | INTRAVENOUS | Status: DC
  Administered 2016-05-26: 2 m[IU]/min via INTRAVENOUS

## 2016-05-26 MED ORDER — OXYCODONE HCL 5 MG/5ML PO SOLN: 5.0000 mg | Freq: Once | ORAL | Status: DC | PRN

## 2016-05-26 MED ORDER — TERBUTALINE SULFATE 1 MG/ML IJ SOLN
0.2500 mg | Freq: Once | INTRAMUSCULAR | Status: AC | PRN
  Administered 2016-05-26: 0.25 mg via SUBCUTANEOUS
  Filled 2016-05-26: qty 1

## 2016-05-26 MED ORDER — LACTATED RINGERS IV SOLN
500.0000 mL | Freq: Once | INTRAVENOUS | Status: AC
  Administered 2016-05-26: 500 mL via INTRAVENOUS

## 2016-05-26 MED ORDER — CEFAZOLIN SODIUM-DEXTROSE 2-3 GM-% IV SOLR
INTRAVENOUS | Status: DC | PRN
  Administered 2016-05-26: 2 g via INTRAVENOUS

## 2016-05-26 MED ORDER — ONDANSETRON HCL 4 MG/2ML IJ SOLN
INTRAMUSCULAR | Status: AC
  Filled 2016-05-26: qty 4

## 2016-05-26 MED ORDER — PHENYLEPHRINE 40 MCG/ML (10ML) SYRINGE FOR IV PUSH (FOR BLOOD PRESSURE SUPPORT): 80.0000 ug | PREFILLED_SYRINGE | INTRAVENOUS | Status: DC | PRN

## 2016-05-26 SURGICAL SUPPLY — 34 items
BENZOIN TINCTURE PRP APPL 2/3 (GAUZE/BANDAGES/DRESSINGS) ×3 IMPLANT
CHLORAPREP W/TINT 26ML (MISCELLANEOUS) ×3 IMPLANT
CLAMP CORD UMBIL (MISCELLANEOUS) IMPLANT
CLOSURE WOUND 1/2 X4 (GAUZE/BANDAGES/DRESSINGS) ×1
CLOTH BEACON ORANGE TIMEOUT ST (SAFETY) ×3 IMPLANT
DRSG OPSITE POSTOP 4X10 (GAUZE/BANDAGES/DRESSINGS) ×3 IMPLANT
ELECT REM PT RETURN 9FT ADLT (ELECTROSURGICAL) ×3
ELECTRODE REM PT RTRN 9FT ADLT (ELECTROSURGICAL) ×1 IMPLANT
EXTRACTOR VACUUM KIWI (MISCELLANEOUS) IMPLANT
GLOVE BIO SURGEON ST LM GN SZ9 (GLOVE) ×3 IMPLANT
GLOVE BIOGEL PI IND STRL 7.0 (GLOVE) ×1 IMPLANT
GLOVE BIOGEL PI IND STRL 9 (GLOVE) ×1 IMPLANT
GLOVE BIOGEL PI INDICATOR 7.0 (GLOVE) ×2
GLOVE BIOGEL PI INDICATOR 9 (GLOVE) ×2
GOWN STRL REUS W/TWL 2XL LVL3 (GOWN DISPOSABLE) ×3 IMPLANT
GOWN STRL REUS W/TWL LRG LVL3 (GOWN DISPOSABLE) ×3 IMPLANT
NEEDLE HYPO 25X5/8 SAFETYGLIDE (NEEDLE) IMPLANT
NS IRRIG 1000ML POUR BTL (IV SOLUTION) ×3 IMPLANT
PACK C SECTION WH (CUSTOM PROCEDURE TRAY) ×3 IMPLANT
PAD OB MATERNITY 4.3X12.25 (PERSONAL CARE ITEMS) ×3 IMPLANT
PENCIL SMOKE EVAC W/HOLSTER (ELECTROSURGICAL) ×3 IMPLANT
RTRCTR C-SECT PINK 25CM LRG (MISCELLANEOUS) IMPLANT
RTRCTR C-SECT PINK 34CM XLRG (MISCELLANEOUS) IMPLANT
STRIP CLOSURE SKIN 1/2X4 (GAUZE/BANDAGES/DRESSINGS) ×2 IMPLANT
SUT MNCRL 0 VIOLET CTX 36 (SUTURE) ×3 IMPLANT
SUT MONOCRYL 0 CTX 36 (SUTURE) ×6
SUT VIC AB 0 CT1 27 (SUTURE) ×2
SUT VIC AB 0 CT1 27XBRD ANBCTR (SUTURE) ×1 IMPLANT
SUT VIC AB 2-0 CT1 27 (SUTURE) ×2
SUT VIC AB 2-0 CT1 TAPERPNT 27 (SUTURE) ×1 IMPLANT
SUT VIC AB 4-0 KS 27 (SUTURE) ×3 IMPLANT
SYR BULB IRRIGATION 50ML (SYRINGE) IMPLANT
TOWEL OR 17X24 6PK STRL BLUE (TOWEL DISPOSABLE) ×3 IMPLANT
TRAY FOLEY CATH SILVER 14FR (SET/KITS/TRAYS/PACK) ×3 IMPLANT

## 2016-05-26 NOTE — Anesthesia Procedure Notes (Signed)
Epidural Patient location during procedure: OB  Staffing Anesthesiologist: Tache Bobst EDWARD Performed: anesthesiologist   Preanesthetic Checklist Completed: patient identified, pre-op evaluation, timeout performed, IV checked, risks and benefits discussed and monitors and equipment checked  Epidural Patient position: sitting Prep: DuraPrep Patient monitoring: blood pressure and continuous pulse ox Approach: midline Location: L3-L4 Injection technique: LOR air  Needle:  Needle type: Tuohy  Needle gauge: 17 G Needle length: 9 cm Needle insertion depth: 7 cm Catheter size: 19 Gauge Catheter at skin depth: 12 cm Test dose: negative and Other (1% Lidocaine)  Additional Notes Patient identified.  Risk benefits discussed including failed block, incomplete pain control, headache, nerve damage, paralysis, blood pressure changes, nausea, vomiting, reactions to medication both toxic or allergic, and postpartum back pain.  Patient expressed understanding and wished to proceed.  All questions were answered.  Sterile technique used throughout procedure and epidural site dressed with sterile barrier dressing. No paresthesia or other complications noted. The patient did not experience any signs of intravascular injection such as tinnitus or metallic taste in mouth nor signs of intrathecal spread such as rapid motor block. Please see nursing notes for vital signs. Reason for block:procedure for pain     

## 2016-05-26 NOTE — Progress Notes (Signed)
LABOR PROGRESS NOTE  Laura Leonard is a 25 y.o. G3P1011 at [redacted]w[redacted]d  admitted for IOL for gHTN. Limited PNC. Called to evaluate repeated variables with good variability.  Subjective: Pt resting comfortably.  Objective: BP 131/75   Pulse 73   Temp 99 F (37.2 C) (Axillary)   Resp 20   Ht 5\' 5"  (1.651 m)   Wt 94.8 kg (209 lb)   LMP 08/19/2015   SpO2 100%   BMI 34.78 kg/m  or  Vitals:   05/26/16 1700 05/26/16 1729 05/26/16 1800 05/26/16 1830  BP: 122/84 107/70 126/75 131/75  Pulse: (!) 145 92 72 73  Resp: 18 18  20   Temp:  98.3 F (36.8 C)  99 F (37.2 C)  TempSrc:  Oral  Axillary  SpO2:      Weight:      Height:        Dilation: 5.5 Effacement (%): 90 Cervical Position: Middle Station: -1 Presentation: Vertex Exam by:: Chanetta Marshall, MD  Labs: Lab Results  Component Value Date   WBC 11.3 (H) 05/26/2016   HGB 8.9 (L) 05/26/2016   HCT 26.7 (L) 05/26/2016   MCV 74.2 (L) 05/26/2016   PLT 260 05/26/2016    Patient Active Problem List   Diagnosis Date Noted  . Gestational hypertension w/o significant proteinuria in 3rd trimester 05/25/2016  . Low grade squamous intraepithelial lesion (LGSIL) on cervical Pap smear, cannot rule out high grade cells on 04/06/16 04/08/2016  . Rh negative, antepartum 04/08/2016  . Rubella non-immune status, antepartum 04/08/2016  . Supervision of normal pregnancy 04/05/2016  . Late prenatal care affecting pregnancy in third trimester 10/14/2014    Assessment / Plan: 25 y.o. G3P1011 at [redacted]w[redacted]d here for IOL for gHTN.  Labor: pit off for now, AROM, amnioinfusion, FSE Fetal Wellbeing:  Cat 2 Pain Control:  epidural Anticipated MOD:  SVD  Loni Muse, MD 05/26/2016, 7:12 PM

## 2016-05-26 NOTE — Brief Op Note (Signed)
05/25/2016 - 05/26/2016  10:58 PM  PATIENT:  Laura Leonard  25 y.o. female  PRE-OPERATIVE DIAGNOSIS:  Primary C-section fetal indication fetal intolerance of labor  POST-OPERATIVE DIAGNOSIS: Pregnancy 40 weeks 1 day delivered  Primary C-section fetal indication fetal intolerance of labor  PROCEDURE:  Procedure(s): CESAREAN SECTION (N/A) primary low transverse SURGEON:  Surgeon(s) and Role:    * Tilda Burrow, MD - Primary  PHYSICIAN ASSISTANT:   ASSISTANTS: Sarah, CST   ANESTHESIA:   epidural  EBL:  Total I/O In: 1000 [I.V.:1000] Out: 1375 [Urine:675; Blood:700]  BLOOD ADMINISTERED:none  DRAINS: Urinary Catheter (Foley)   LOCAL MEDICATIONS USED:  NONE  SPECIMEN:  Source of Specimen:  Placenta to labor and delivery  DISPOSITION OF SPECIMEN:  Labor and delivery  COUNTS:  YES  TOURNIQUET:  * No tourniquets in log *  DICTATION: .Dragon Dictation  PLAN OF CARE: Has active admission orders  PATIENT DISPOSITION:  PACU - hemodynamically stable.   Delay start of Pharmacological VTE agent (>24hrs) due to surgical blood loss or risk of bleeding: not applicable Indications 25 year old multipara induced due to gestational hypertension with arrest at 6 cm -1-0 station with fetal intolerance of induction of labor with repetitive deep variable decelerations with each contraction. Patient was 5 cm at 5 PM and only 6 cm at 9 PM with repetitive decelerations with each contraction series Details of procedure patient was taken operating room prepped and draped for lower abdominal surgery with Foley vaginal prep performed Foley catheter in place and lower abdomen prepped and draped. The vertex was noted to be somewhat asynclitic with the vision suprapubic irritation on the vertex to be oriented toward the right side of the pelvis and upon cheating the uterus and opening at the year was encountered first as the baby was overly flexed and not well aligned with the midpelvis Axis IV  progress. In other words the body was more on the left side the head was impacted on the right hemipelvis and not progressing. There was no nuchal cord and no malodor uterus was had a transverse incision extended somewhat inferiorly serially left-sided for distance about 3 cm, did not extend into the cervix itself. Details of procedure patient was taken operating room prepped and draped for lower abdominal surgery timeout conducted Ancef administered and transverse lower abdominal incision performed with sharp dissection to the fascia which was opened transversely dissected off the underlying connective tissue elevated superiorly and inferiorly and the mid at the peritoneal cavity easily performed. Alexis wound retractor was positioned. Laparoscope was developed. Transverse incision was made in the lower uterine segment and was noted to be at the level of fetal ear, the right ear. Extended laterally and vertically with index traction then the fetal vertex rotated from its rather impacted position into the incision and expelled. They have been challenging but was easily was successfully accomplished and fundal pressure expelled the baby through this skin incision cord was clamped and the patient is a be passed to the pediatricians in good condition. At it was.left attached to 1 minute. Placenta and responded nicely to uterine massage and IV Pitocin and delivered intact membranes, Schultz presentation. Uterus was irrigated, all packed away tape, and the uterus closed in running locking first layer 0 Monocryl followed by continuous running second layer. Prior to closure the uterine incision was necessary to close the inferior extension on the patient's left side which went down  3 cm toward the cervix the cervix could be identified the incision did  not extend into the uterine vessels nor did it extend into the cervix. This was closed with single layer closure. Second layer closure was continuous running 0 Monocryl  and good hemostasis and tissue approximation was achieved. The irrigating, anterior peritoneum closed with 2-0 Vicryl, fascia closed with 0 Vicryl after irrigation, subcutaneous tissues irrigated and closed with interrupted horizontal mattress sutures of 20 plain and then subcuticular 4-0 Vicryl close the skin with good tissue edge approximation. 700 cc of EBL, condition to recovery good. Marland Kitchen

## 2016-05-26 NOTE — Transfer of Care (Signed)
Immediate Anesthesia Transfer of Care Note  Patient: Laura Leonard  Procedure(s) Performed: Procedure(s): CESAREAN SECTION (N/A)  Patient Location: PACU  Anesthesia Type:Epidural  Level of Consciousness: awake, alert  and oriented  Airway & Oxygen Therapy: Patient Spontanous Breathing  Post-op Assessment: Report given to RN and Post -op Vital signs reviewed and stable  Post vital signs: Reviewed and stable  Last Vitals:  Vitals:   05/26/16 2026 05/26/16 2030  BP: (!) 129/50 (!) 130/51  Pulse: (!) 119 (!) 121  Resp:    Temp:  37.4 C    Last Pain:  Vitals:   05/26/16 2030  TempSrc: Axillary  PainSc:          Complications: No apparent anesthesia complications

## 2016-05-26 NOTE — Op Note (Signed)
Please see the brief operative note for surgical details of this surgery, a transverse uterine incision in the lower uterine segment.

## 2016-05-26 NOTE — Progress Notes (Signed)
Patient ID: Laura Leonard, female   DOB: 11-Dec-1990, 25 y.o.   MRN: [id number]  CTSP for early/prolonged variables x 2, each 1.5-2 mins, one to 70s and one to 90s w/ ctx. Pt had been in hands and knees position prior and baby was toleraterating ctx well, but became tired and moved to right exaggerated Sim's prior to the variables. VSS, afeb FHR now at baseline 150s w/ ?PACs Cx 6/70 puffy/-1/-2 Ctx now q 3-4 mins without Pit   IUP@term  gHTN Early active phase, but prob w/ inadequate ctx now  Will continue to watch FHT closely; rev'd w/ pt and family the strong potential for C/S with continued variables  Cam Hai CNM 05/26/2016 8:38 PM

## 2016-05-26 NOTE — Anesthesia Preprocedure Evaluation (Signed)
Anesthesia Evaluation  Patient identified by MRN, date of birth, ID band Patient awake    Reviewed: Allergy & Precautions, NPO status , Patient's Chart, lab work & pertinent test results  History of Anesthesia Complications Negative for: history of anesthetic complications  Airway Mallampati: II  TM Distance: >3 FB Neck ROM: Full    Dental  (+) Teeth Intact, Dental Advisory Given   Pulmonary neg pulmonary ROS, former smoker,    Pulmonary exam normal breath sounds clear to auscultation       Cardiovascular negative cardio ROS Normal cardiovascular exam Rhythm:Regular Rate:Normal     Neuro/Psych  Headaches, PSYCHIATRIC DISORDERS Depression    GI/Hepatic negative GI ROS, Neg liver ROS,   Endo/Other  Obesity   Renal/GU negative Renal ROS     Musculoskeletal negative musculoskeletal ROS (+)   Abdominal   Peds  Hematology  (+) Blood dyscrasia, anemia , Plt 260k   Anesthesia Other Findings Day of surgery medications reviewed with the patient.  Reproductive/Obstetrics (+) Pregnancy Gestational HTN                             Anesthesia Physical Anesthesia Plan  ASA: II  Anesthesia Plan: Epidural   Post-op Pain Management:    Induction:   Airway Management Planned:   Additional Equipment:   Intra-op Plan:   Post-operative Plan:   Informed Consent: I have reviewed the patients History and Physical, chart, labs and discussed the procedure including the risks, benefits and alternatives for the proposed anesthesia with the patient or authorized representative who has indicated his/her understanding and acceptance.   Dental advisory given  Plan Discussed with:   Anesthesia Plan Comments: (Patient identified. Risks/Benefits/Options discussed with patient including but not limited to bleeding, infection, nerve damage, paralysis, failed block, incomplete pain control, headache, blood  pressure changes, nausea, vomiting, reactions to medication both or allergic, itching and postpartum back pain. Confirmed with bedside nurse the patient's most recent platelet count. Confirmed with patient that they are not currently taking any anticoagulation, have any bleeding history or any family history of bleeding disorders. Patient expressed understanding and wished to proceed. All questions were answered. )        Anesthesia Quick Evaluation

## 2016-05-26 NOTE — Progress Notes (Signed)
Laura Leonard is a 25 y.o. G3P1011 at [redacted]w[redacted]d  admitted for [facility]. Pt has stopped progressing at 6 cm, minus station, and fetal intolerance of labor prevents induction effort. The pt has had IUPC inserted, amnioinfusion done, and now contraction return is accompanied by repetitive decels with each contraction in to 90's, with beat to beat still present. Cesarean section is recommended. Risks and benefits discussed. Pt is initially reluctant, but agrees to recommendation and rationale.  Subjective:   Objective: BP (!) 130/51   Pulse (!) 121   Temp 99.3 F (37.4 C) (Axillary)   Resp 20   Ht 5\' 5"  (1.651 m)   Wt 209 lb (94.8 kg)   LMP 08/19/2015   SpO2 100%   BMI 34.78 kg/m  I/O last 3 completed shifts: In: 650 [Other:650] Out: 1250 [Urine:1250] Total I/O In: -  Out: 525 [Urine:525]  FHT:  FHR: 145 bpm, variability: marked,  accelerations:  Present,  decelerations:  Present variables and prolonged decel UC:    SVE:   Dilation: 6.5 Effacement (%): 70 Station: -1 Exam by:: Pincus Badder CNM   Labs: Lab Results  Component Value Date   WBC 11.3 (H) 05/26/2016   HGB 8.9 (L) 05/26/2016   HCT 26.7 (L) 05/26/2016   MCV 74.2 (L) 05/26/2016   PLT 260 05/26/2016    Assessment / Plan: fetal intolerance of labor.  Labor: stopped Preeclampsia:   Fetal Wellbeing:  Category II Pain Control:  Epidural I/D:  . Anticipated MOD:  Cesarean section  Laura Leonard 05/26/2016, 8:59 PM

## 2016-05-27 ENCOUNTER — Encounter (HOSPITAL_COMMUNITY): Payer: Self-pay | Admitting: Obstetrics and Gynecology

## 2016-05-27 LAB — CBC
HCT: 24.9 % — ABNORMAL LOW (ref 36.0–46.0)
HEMATOCRIT: 23.6 % — AB (ref 36.0–46.0)
HEMOGLOBIN: 7.8 g/dL — AB (ref 12.0–15.0)
Hemoglobin: 8.1 g/dL — ABNORMAL LOW (ref 12.0–15.0)
MCH: 24.3 pg — AB (ref 26.0–34.0)
MCH: 24.6 pg — ABNORMAL LOW (ref 26.0–34.0)
MCHC: 32.5 g/dL (ref 30.0–36.0)
MCHC: 33.1 g/dL (ref 30.0–36.0)
MCV: 74.4 fL — AB (ref 78.0–100.0)
MCV: 74.8 fL — ABNORMAL LOW (ref 78.0–100.0)
PLATELETS: 280 10*3/uL (ref 150–400)
Platelets: 260 10*3/uL (ref 150–400)
RBC: 3.17 MIL/uL — AB (ref 3.87–5.11)
RBC: 3.33 MIL/uL — ABNORMAL LOW (ref 3.87–5.11)
RDW: 14.6 % (ref 11.5–15.5)
RDW: 14.6 % (ref 11.5–15.5)
WBC: 18.3 10*3/uL — ABNORMAL HIGH (ref 4.0–10.5)
WBC: 18.5 10*3/uL — AB (ref 4.0–10.5)

## 2016-05-27 MED ORDER — PRENATAL MULTIVITAMIN CH
1.0000 | ORAL_TABLET | Freq: Every day | ORAL | Status: DC
  Filled 2016-05-27: qty 1

## 2016-05-27 MED ORDER — DIBUCAINE 1 % RE OINT: 1.0000 "application " | TOPICAL_OINTMENT | RECTAL | Status: DC | PRN

## 2016-05-27 MED ORDER — MENTHOL 3 MG MT LOZG: 1.0000 | LOZENGE | OROMUCOSAL | Status: DC | PRN

## 2016-05-27 MED ORDER — WITCH HAZEL-GLYCERIN EX PADS: 1.0000 "application " | MEDICATED_PAD | CUTANEOUS | Status: DC | PRN

## 2016-05-27 MED ORDER — KETOROLAC TROMETHAMINE 30 MG/ML IJ SOLN
30.0000 mg | Freq: Once | INTRAMUSCULAR | Status: AC
  Administered 2016-05-27: 30 mg via INTRAMUSCULAR

## 2016-05-27 MED ORDER — FERROUS SULFATE 325 (65 FE) MG PO TABS
325.0000 mg | ORAL_TABLET | Freq: Every day | ORAL | Status: DC
  Administered 2016-05-27 – 2016-05-28 (×2): 325 mg via ORAL
  Filled 2016-05-27 (×2): qty 1

## 2016-05-27 MED ORDER — SIMETHICONE 80 MG PO CHEW
80.0000 mg | CHEWABLE_TABLET | Freq: Three times a day (TID) | ORAL | Status: DC
  Administered 2016-05-27 – 2016-05-28 (×4): 80 mg via ORAL
  Filled 2016-05-27 (×4): qty 1

## 2016-05-27 MED ORDER — ACETAMINOPHEN 325 MG PO TABS
650.0000 mg | ORAL_TABLET | ORAL | Status: DC | PRN
  Administered 2016-05-27: 650 mg via ORAL
  Filled 2016-05-27: qty 2

## 2016-05-27 MED ORDER — RHO D IMMUNE GLOBULIN 1500 UNIT/2ML IJ SOSY
300.0000 ug | PREFILLED_SYRINGE | Freq: Once | INTRAMUSCULAR | Status: AC
Start: 2016-05-27 — End: 2016-05-27
  Administered 2016-05-27: 300 ug via INTRAMUSCULAR
  Filled 2016-05-27: qty 2

## 2016-05-27 MED ORDER — OXYTOCIN 40 UNITS IN LACTATED RINGERS INFUSION - SIMPLE MED: 2.5000 [IU]/h | INTRAVENOUS | Status: AC

## 2016-05-27 MED ORDER — SIMETHICONE 80 MG PO CHEW
80.0000 mg | CHEWABLE_TABLET | ORAL | Status: DC
  Administered 2016-05-27: 80 mg via ORAL
  Filled 2016-05-27: qty 1

## 2016-05-27 MED ORDER — SENNOSIDES-DOCUSATE SODIUM 8.6-50 MG PO TABS
2.0000 | ORAL_TABLET | ORAL | Status: DC
  Administered 2016-05-27: 2 via ORAL
  Filled 2016-05-27: qty 2

## 2016-05-27 MED ORDER — TETANUS-DIPHTH-ACELL PERTUSSIS 5-2.5-18.5 LF-MCG/0.5 IM SUSP
0.5000 mL | Freq: Once | INTRAMUSCULAR | Status: AC
  Administered 2016-05-27: 0.5 mL via INTRAMUSCULAR
  Filled 2016-05-27: qty 0.5

## 2016-05-27 MED ORDER — OXYCODONE-ACETAMINOPHEN 5-325 MG PO TABS
1.0000 | ORAL_TABLET | ORAL | Status: DC | PRN
  Administered 2016-05-27 – 2016-05-28 (×4): 2 via ORAL
  Filled 2016-05-27 (×4): qty 2

## 2016-05-27 MED ORDER — LACTATED RINGERS IV SOLN: INTRAVENOUS | Status: DC

## 2016-05-27 MED ORDER — IBUPROFEN 600 MG PO TABS
600.0000 mg | ORAL_TABLET | Freq: Four times a day (QID) | ORAL | Status: DC
  Administered 2016-05-27 – 2016-05-28 (×6): 600 mg via ORAL
  Filled 2016-05-27 (×6): qty 1

## 2016-05-27 MED ORDER — KETOROLAC TROMETHAMINE 30 MG/ML IJ SOLN
INTRAMUSCULAR | Status: AC
  Filled 2016-05-27: qty 1

## 2016-05-27 MED ORDER — SIMETHICONE 80 MG PO CHEW: 80.0000 mg | CHEWABLE_TABLET | ORAL | Status: DC | PRN

## 2016-05-27 MED ORDER — ZOLPIDEM TARTRATE 5 MG PO TABS: 5.0000 mg | ORAL_TABLET | Freq: Every evening | ORAL | Status: DC | PRN

## 2016-05-27 MED ORDER — DIPHENHYDRAMINE HCL 25 MG PO CAPS
25.0000 mg | ORAL_CAPSULE | Freq: Four times a day (QID) | ORAL | Status: DC | PRN
  Administered 2016-05-27 – 2016-05-28 (×2): 25 mg via ORAL
  Filled 2016-05-27 (×2): qty 1

## 2016-05-27 MED ORDER — MEASLES, MUMPS & RUBELLA VAC ~~LOC~~ INJ
0.5000 mL | INJECTION | Freq: Once | SUBCUTANEOUS | Status: DC
  Filled 2016-05-27: qty 0.5

## 2016-05-27 MED ORDER — COCONUT OIL OIL: 1.0000 "application " | TOPICAL_OIL | Status: DC | PRN

## 2016-05-27 MED ORDER — COMPLETENATE 29-1 MG PO CHEW
1.0000 | CHEWABLE_TABLET | Freq: Every day | ORAL | Status: DC
  Filled 2016-05-27 (×2): qty 1

## 2016-05-27 NOTE — Addendum Note (Signed)
Addendum  created 05/27/16 0700 by Sheldon Silvan, MD   Sign clinical note

## 2016-05-27 NOTE — Anesthesia Postprocedure Evaluation (Signed)
Anesthesia Post Note  Patient: Laura Leonard  Procedure(s) Performed: Procedure(s) (LRB): CESAREAN SECTION (N/A)  Patient location during evaluation: Mother Baby Anesthesia Type: Epidural Level of consciousness: awake, awake and alert, oriented and patient cooperative Pain management: pain level controlled Vital Signs Assessment: post-procedure vital signs reviewed and stable Respiratory status: spontaneous breathing, nonlabored ventilation and respiratory function stable Cardiovascular status: stable Postop Assessment: patient able to bend at knees, no headache, no backache and no signs of nausea or vomiting Anesthetic complications: no     Last Vitals:  Vitals:   05/27/16 0300 05/27/16 0430  BP: 130/68   Pulse: 82   Resp: 18 18  Temp: 36.7 C 36.8 C    Last Pain:  Vitals:   05/27/16 0430  TempSrc:   PainSc: 6    Pain Goal:                 Aldon Hengst L

## 2016-05-27 NOTE — Progress Notes (Signed)
Patient ID: Laura Leonard, female   DOB: 10-06-1991, 25 y.o.   MRN: [id number]   Post Op Day #1 pLTCS  Subjective: Patient doing well. Had issues with pain control overnight but is now managed with percocet. No pain currently, but 7/10 pain when ambulating. Reports one episode of dizziness upon standing. + flatus. Foley and SCD boots still in place.  MOF: bottle. MOC: none desired.   Denies SOB, calf pain.  Objective: Blood pressure 130/68, pulse 82, temperature 98.2 F (36.8 C), resp. rate 18, height 5\' 5"  (1.651 m), weight 94.8 kg (209 lb), last menstrual period 08/19/2015, SpO2 100 %, unknown if currently breastfeeding.  Physical Exam:  General: alert, cooperative and no distress Lochia: appropriate Uterine Fundus: firm Incision: healing well, no significant drainage, no significant erythema DVT Evaluation: No evidence of DVT seen on physical exam.  Results for orders placed or performed during the hospital encounter of 05/25/16 (from the past 24 hour(s))  CBC     Status: Abnormal   Collection Time: 05/26/16 11:45 PM  Result Value Ref Range   WBC 18.3 (H) 4.0 - 10.5 K/uL   RBC 3.33 (L) 3.87 - 5.11 MIL/uL   Hemoglobin 8.1 (L) 12.0 - 15.0 g/dL   HCT 21.9 (L) 75.8 - 83.2 %   MCV 74.8 (L) 78.0 - 100.0 fL   MCH 24.3 (L) 26.0 - 34.0 pg   MCHC 32.5 30.0 - 36.0 g/dL   RDW 54.9 82.6 - 41.5 %   Platelets 280 150 - 400 K/uL  CBC     Status: Abnormal   Collection Time: 05/27/16  5:57 AM  Result Value Ref Range   WBC 18.5 (H) 4.0 - 10.5 K/uL   RBC 3.17 (L) 3.87 - 5.11 MIL/uL   Hemoglobin 7.8 (L) 12.0 - 15.0 g/dL   HCT 83.0 (L) 94.0 - 76.8 %   MCV 74.4 (L) 78.0 - 100.0 fL   MCH 24.6 (L) 26.0 - 34.0 pg   MCHC 33.1 30.0 - 36.0 g/dL   RDW 08.8 11.0 - 31.5 %   Platelets 260 150 - 400 K/uL     Recent Labs  05/26/16 2345 05/27/16 0557  HGB 8.1* 7.8*  HCT 24.9* 23.6*    Assessment/Plan: Laura Leonard is a 25yo AA female G3P1011 POD#1 pLTCS for fetal intolerance. IOL  for GHTN. Doing well currently.  - GHTN: BPs WNLs since delivery - Anemia: Hbg 8.7 to 7.8 post op. Continue to monitor symptoms.  - B neg: Administer Rhogam - Consider D/C to home tomorrow.    LOS: 2 days   Maryelizabeth Kaufmann, Medical Student 05/27/2016, 7:37 AM    OB FELLOW MEDICAL STUDENT NOTE ATTESTATION  I have seen and examined this patient. Note this is a Psychologist, occupational note and as such does not necessarily reflect the patient's plan of care. Please see progress note for this date of service.    Jen Mow, DO OB Fellow 05/28/2016, 12:00 AM

## 2016-05-27 NOTE — Anesthesia Postprocedure Evaluation (Signed)
Anesthesia Post Note  Patient: Laura Leonard  Procedure(s) Performed: Procedure(s) (LRB): CESAREAN SECTION (N/A)  Patient location during evaluation: PACU Anesthesia Type: General Level of consciousness: awake and alert Pain management: pain level controlled Vital Signs Assessment: post-procedure vital signs reviewed and stable Respiratory status: spontaneous breathing, nonlabored ventilation, respiratory function stable and patient connected to nasal cannula oxygen Cardiovascular status: blood pressure returned to baseline and stable Postop Assessment: no signs of nausea or vomiting Anesthetic complications: no     Last Vitals:  Vitals:   05/27/16 0300 05/27/16 0430  BP: 130/68   Pulse: 82   Resp: 18 18  Temp: 36.7 C 36.8 C    Last Pain:  Vitals:   05/27/16 0430  TempSrc:   PainSc: 6    Pain Goal:                 Silvio Sausedo A

## 2016-05-27 NOTE — Progress Notes (Signed)
CLINICAL SOCIAL WORK MATERNAL/CHILD NOTE  Patient Details  Name: Laura Leonard MRN: [id number] Date of Birth: 05/26/2017  Date:  05/27/2016  Clinical Social Worker Initiating Note:  Lilly Cove, [place]            Date/ Time Initiated:  05/27/16/1507                        Child's Name:  Laura Leonard   Legal Guardian:  Mother   Need for Interpreter:  None   Date of Referral:  05/27/16     Reason for Referral:  Current Substance Use/Substance Use During Pregnancy , Other (Comment) (hx of depression)   Referral Source:  RN   Address:    [place] Phone number:    [phone]  Household Members: Parents, Minor Children, Siblings   Natural Supports (not living in the home): [place], Extended Family, Friends   Professional Supports:None   Employment:Full-time   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  LCSW met with MOB and MOB's mother MGM.  MOB gives permission to speak in front of mother as she reports  She already knows I smoked, I told her".  MOB reports she took a test at the first of the year, and it was positive for pregnancy.  Reports she had a lot of bleeding and thought she miscarried, thus she continued on with life and did not follow up.  She continued to bleed heavy per report throughout the next months, making her think she was having her period.  In May of 2017 she reports a lot  of pain and bleeding again, went to [facility] ED and found out through a blood test she was pregnant.  She thought at that time it was a new pregnancy, but found out she was actually thirty weeks pregnant.  MOB reports she stopped all substances (alcohol and THC) and started going to appointments. She returned to [facility] and was referred to MFM clinic due to late prenatal care and bleeding.  MOB reports she called her mother [facility] Laura Leonard) for support and moved back home with family.  MOB reports she lives with her mother, step mother, sister (67) and MOB's daughter (31 years old).  Reports FOB is aware of baby, but not choosing at this time to be part of MOB's and child's life. MOB denied all other use of drugs.  Report she did drink alcohol and smoked.  She was made aware the UDS for MOB and baby were positive for THC.  She was made aware of hospital drug policy, late prenatal care and report to be made with [facility] CPS.  MOB denies any CPS in past with her one daughter.    MOB also engaged with hx of depression and depressive symptoms.   MOB reports she was dealing with relationship with baby's father and it was more acute stress  than depression.  She reports she was never on medication or in treatment/therapy, that it was just a rough time with regards to baby's father.  Now since moving home she reports she is in a much better situation and has good support from family and not experienced any stress related to relationship or depressed mood.  MOB aware of PPD and LCSW explained risks and symptoms.  MGM reports she will be with MOB and assist with post partum if needed.  MOB reports she plans to return to work in the next 3-4 weeks at [employer] and change her schedule.  MOB will work during the day while MGM is at work with [employer] and then when [facility] comes home she will watch 26 year old and newborn to prevent newborn from going to daycare.   MOB reports they do not have everything  yet for baby, but many family members are helping out with crib, car seat, and diapers/clothing. Reports no needs currently, but will follow up if needs arise prior to DC.  MOB planning DC tomorrow if medically stable. Plans to bottle feed at this time and reports no issues.  MOB is bonding with baby as during assessment she is is doing skin to skin after feeding.  MOB expresses joy for baby as she was not planned, but reports she is excited she is here, healthy and doing well.  MOB reports they are apply for medicaid, [place], food stamps.  They may transfer care to [facility] as other child is at [facility], but feel they may have better experience with [facility].    No other needs voiced. Aware of how to reach SW.  CSW Plan/Description: Child Protective Service Report , Psychosocial Support and Ongoing Assessment of Needs   LCSW completed report with [facility] CPS on 8/3 at 3:30pm. Awaiting call back regarding CPS determination.  Will monitor cord and submit results when available.   Lilly Cove, LCSW 05/27/2016, 3:08 PM

## 2016-05-27 NOTE — Progress Notes (Signed)
Patient ID: Laura Leonard, female   DOB: [birth year], 25 y.o.   MRN: [id number]  POSTPARTUM PROGRESS NOTE  Post Op/Partum Day #1 Subjective:  Laura Leonard is a 25 y.o. G3P1011 [redacted]w[redacted]d s/p PLTCS for NRFHT.  No acute events overnight.  Pt denies problems with ambulating, voiding or po intake.  She denies nausea or vomiting.  Pain is moderately controlled.  She has had flatus. She has not had bowel movement.  Lochia Small.   Objective: Blood pressure 121/63, pulse 62, temperature 98.2 F (36.8 C), resp. rate 20, height 5\' 5"  (1.651 m), weight 209 lb (94.8 kg), last menstrual period 08/19/2015, SpO2 98 %, unknown if currently breastfeeding.  Physical Exam:  General: alert, cooperative and no distress Lochia:normal flow Chest: CTAB Heart: RRR no m/r/g Abdomen: +BS, soft, nontender,  Uterine Fundus: firm, below umbilicus DVT Evaluation: No calf swelling or tenderness Extremities: trace edema   Recent Labs  05/26/16 2345 05/27/16 0557  HGB 8.1* 7.8*  HCT 24.9* 23.6*    Assessment/Plan:  ASSESSMENT: Laura Leonard is a 25 y.o. G3P1011 [redacted]w[redacted]d s/p PLTCS.  Plan for discharge tomorrow, Breastfeeding and Contraception undecided, considering LARC   LOS: 2 days   Jen Mow, DO 05/27/2016, 10:42 AM

## 2016-05-27 NOTE — Addendum Note (Signed)
Addendum  created 05/27/16 0341 by Elbert Ewings, CRNA   Anesthesia Event deleted

## 2016-05-27 NOTE — Addendum Note (Signed)
Addendum  created 05/27/16 0805 by Yolonda Kida, CRNA   Charge Capture section accepted, Sign clinical note

## 2016-05-27 NOTE — Anesthesia Postprocedure Evaluation (Signed)
Anesthesia Post Note  Patient: Laura Leonard  Procedure(s) Performed: Procedure(s) (LRB): CESAREAN SECTION (N/A)  Patient location during evaluation: PACU Anesthesia Type: Epidural Level of consciousness: awake and alert Pain management: pain level controlled Vital Signs Assessment: post-procedure vital signs reviewed and stable Respiratory status: spontaneous breathing, nonlabored ventilation and respiratory function stable Cardiovascular status: stable Postop Assessment: no headache, no backache and epidural receding Anesthetic complications: no     Last Vitals:  Vitals:   05/27/16 0030 05/27/16 0032  BP: (!) 128/106 126/80  Pulse: 88   Resp: (!) 21 (!) 25  Temp:      Last Pain:  Vitals:   05/27/16 0032  TempSrc:   PainSc: 0-No pain   Pain Goal:                 [name] A

## 2016-05-27 NOTE — Progress Notes (Signed)
UR chart review completed.  

## 2016-05-27 NOTE — Addendum Note (Signed)
Addendum  created 05/27/16 0329 by Elbert Ewings, CRNA   Anesthesia Event edited

## 2016-05-28 LAB — RH IG WORKUP (INCLUDES ABO/RH)
ABO/RH(D): B NEG
FETAL SCREEN: NEGATIVE
Gestational Age(Wks): 40.2
UNIT DIVISION: 0

## 2016-05-28 MED ORDER — SENNOSIDES-DOCUSATE SODIUM 8.6-50 MG PO TABS: 2.0000 | ORAL_TABLET | ORAL | 1 refills | Status: DC

## 2016-05-28 MED ORDER — OXYCODONE-ACETAMINOPHEN 5-325 MG PO TABS: 1.0000 | ORAL_TABLET | Freq: Four times a day (QID) | ORAL | 0 refills | Status: DC | PRN

## 2016-05-28 NOTE — Discharge Summary (Signed)
Obstetric Discharge Summary Reason for Admission:  presenting for IOL for gHTN Prenatal Procedures: ultrasound Intrapartum Procedures: cesarean: low cervical, transverse due to fetal intolerance of labor Postpartum Procedures: MMR vaccine Complications-Operative and Postpartum: none Indications 25 year old multipara induced due to gestational hypertension with arrest at 6 cm -1-0 station with fetal intolerance of induction of labor with repetitive deep variable decelerations with each contraction. Patient was 5 cm at 5 PM and only 6 cm at 9 PM with repetitive decelerations with each contraction series Hemoglobin  Date Value Ref Range Status  05/27/2016 7.8 (L) 12.0 - 15.0 g/dL Final   HCT  Date Value Ref Range Status  05/27/2016 23.6 (L) 36.0 - 46.0 % Final    Physical Exam:  General: alert, cooperative and no distress Lochia: appropriate Uterine Fundus: firm Incision: healing well, no significant drainage, no significant erythema DVT Evaluation: No evidence of DVT seen on physical exam.  Discharge Diagnoses: Term Pregnancy-delivered  Discharge Information: Date: 05/28/2016 Activity: pelvic rest Diet: routine Medications: prenatal vitamins, ibuprofen, & 12 percocet tablets for pain Condition: stable Instructions: refer to practice specific booklet Discharge to: home   Newborn Data: Live born female  Birth Weight: 8 lb 2 oz (3685 g) APGAR: 8, 9  Home with mother.  Casimer Leek, MD, PGY-1, MPH 05/28/2016, 7:02 AM   CNM attestation I have seen and examined this patient and agree with above documentation in the resident's note.   Laura Leonard is a 25 y.o. G3P1011 s/p pLTCS.   Pain is well controlled.  Plan for birth control is undecided.  Method of Feeding: breast  PE:  BP 123/72 (BP Location: Right Arm)   Pulse 64   Temp 98 F (36.7 C) (Oral)   Resp 18   Ht 5' 5"  (1.651 m)   Wt 94.8 kg (209 lb)   LMP 08/19/2015   SpO2 97%   BMI 34.78 kg/m  Fundus  firm  No results for input(s): HGB, HCT in the last 72 hours.   Plan: discharge today - postpartum care discussed - f/u clinic in 6 weeks for postpartum visit   SHAW, KIMBERLY, CNM 8:57 AM

## 2016-05-28 NOTE — Progress Notes (Signed)
CSW spoke with CPS worker  (Pam Miller).  CPS worker informed CSW that CPS will follow-up with MOB after MOB and infant is D/C.  There are no barriers for D/C for infant.  Brielle Moro Boyd-Gilyard, MSW, LCSW Clinical Social Work [phone]  

## 2016-06-03 ENCOUNTER — Encounter (HOSPITAL_COMMUNITY): Payer: Self-pay | Admitting: *Deleted

## 2016-06-14 ENCOUNTER — Encounter: Payer: Self-pay | Admitting: Obstetrics and Gynecology

## 2016-07-23 ENCOUNTER — Ambulatory Visit: Payer: Medicaid Other | Admitting: Obstetrics and Gynecology

## 2017-01-11 ENCOUNTER — Encounter (HOSPITAL_COMMUNITY): Payer: Self-pay

## 2017-01-11 ENCOUNTER — Emergency Department (HOSPITAL_COMMUNITY)
Admission: EM | Admit: 2017-01-11 | Discharge: 2017-01-11 | Disposition: A | Discharge status: Home / Self Care | Payer: Medicaid Other | Attending: Emergency Medicine | Admitting: Emergency Medicine

## 2017-01-11 DIAGNOSIS — O26891 Other specified pregnancy related conditions, first trimester: Secondary | ICD-10-CM | POA: Insufficient documentation

## 2017-01-11 DIAGNOSIS — Z79899 Other long term (current) drug therapy: Secondary | ICD-10-CM | POA: Insufficient documentation

## 2017-01-11 DIAGNOSIS — Z3A01 Less than 8 weeks gestation of pregnancy: Secondary | ICD-10-CM | POA: Insufficient documentation

## 2017-01-11 DIAGNOSIS — R1032 Left lower quadrant pain: Secondary | ICD-10-CM | POA: Insufficient documentation

## 2017-01-11 DIAGNOSIS — Z87891 Personal history of nicotine dependence: Secondary | ICD-10-CM | POA: Insufficient documentation

## 2017-01-11 DIAGNOSIS — I10 Essential (primary) hypertension: Secondary | ICD-10-CM | POA: Insufficient documentation

## 2017-01-11 DIAGNOSIS — R1031 Right lower quadrant pain: Secondary | ICD-10-CM | POA: Insufficient documentation

## 2017-01-11 DIAGNOSIS — Z3490 Encounter for supervision of normal pregnancy, unspecified, unspecified trimester: Secondary | ICD-10-CM

## 2017-01-11 LAB — URINALYSIS, ROUTINE W REFLEX MICROSCOPIC
BILIRUBIN URINE: NEGATIVE
GLUCOSE, UA: NEGATIVE mg/dL
Hgb urine dipstick: NEGATIVE
Ketones, ur: NEGATIVE mg/dL
Leukocytes, UA: NEGATIVE
NITRITE: NEGATIVE
PH: 5 (ref 5.0–8.0)
Protein, ur: NEGATIVE mg/dL
SPECIFIC GRAVITY, URINE: 1.025 (ref 1.005–1.030)

## 2017-01-11 LAB — PREGNANCY, URINE: Preg Test, Ur: POSITIVE — AB

## 2017-01-11 NOTE — ED Triage Notes (Signed)
Patient complains of abdominal cramping with heamturia x 1 week. States that she has pressure with urination. NAD. No nausea, no vomiting

## 2017-01-11 NOTE — ED Notes (Signed)
Inquired about blood draw at triage, pt stated she had "3 tubes drawn" but RN found no labs ordered. Lab contacted who confirmed that they received samples but were waiting on orders. EDP informed. Urine Preg ordered thereafter. Delay explained to pt by RN.

## 2017-01-11 NOTE — ED Notes (Signed)
ED Provider at bedside. 

## 2017-01-11 NOTE — ED Provider Notes (Signed)
[facility]-EMERGENCY DEPT Provider Note   CSN: [id number] Arrival date & time: 01/11/17  1145     History   Chief Complaint Chief Complaint  Patient presents with  . Abdominal Pain  . Hematuria    HPI Laura Leonard is a 26 y.o. female.  Pt presents w c/o hematuria x2days w crampy lower abdl pain x2 weeks. Pt reports lower abdl pain is crampy and feels like menstrual cramps. Yesterday she noticed blood in the toilet after urinating so she used a sanitary napkin and then noticed a few "specks" of bright red blood on the pad. She feels pressure w urination. Reports subjective fevers and intermittent watery diarrhea x4-5days. LMP was February 28 and they are regular and last 4 days, pt reports she continued to have her periods throughout previous pregnancies. Denies inc vaginal discharge, N/V, inc urinary frequency, flank pain, changes in appetite, CP, SOB. Denies hx nephrolithiasis. Reports previous hx UTI's but says her current sx do not feel the same. Surgical hx includes c-section 05/2016 without complications.      Past Medical History:  Diagnosis Date  . Depression   . Headache   . Hypertension   . Trichomonas contact     Patient Active Problem List   Diagnosis Date Noted  . Single liveborn, born in hospital, delivered by cesarean delivery 05/28/2016  . Gestational hypertension w/o significant proteinuria in 3rd trimester 05/25/2016  . Low grade squamous intraepithelial lesion (LGSIL) on cervical Pap smear, cannot rule out high grade cells on 04/06/16 04/08/2016  . Rh negative, antepartum 04/08/2016  . Rubella non-immune status, antepartum 04/08/2016  . Supervision of normal pregnancy 04/05/2016  . Late prenatal care affecting pregnancy in third trimester 10/14/2014    Past Surgical History:  Procedure Laterality Date  . CESAREAN SECTION N/A 05/26/2016   Procedure: CESAREAN SECTION;  Surgeon: Tilda BurrowJohn V Ferguson, MD;  [facility];  Service: Obstetrics;   Laterality: N/A;  . NO PAST SURGERIES      OB History    Gravida Para Term Preterm AB Living   3 2 2   1 2    SAB TAB Ectopic Multiple Live Births   0 1   0 1       Home Medications    Prior to Admission medications   Medication Sig Start Date End Date Taking? Authorizing Provider  oxyCODONE-acetaminophen (PERCOCET/ROXICET) 5-325 MG tablet Take 1-2 tablets by mouth every 6 (six) hours as needed for severe pain (1 Tablet for pain scale 4-7, 2 Tablets for pain scale >7). 05/28/16   Quenten Ravenricia R Brein, MD  prenatal vitamin w/FE, FA (PRENATAL 1 + 1) 27-1 MG TABS tablet Take 1 tablet by mouth daily at 12 noon. 03/22/16   Cheral MarkerKimberly R Booker, CNM  senna-docusate (SENOKOT-S) 8.6-50 MG tablet Take 2 tablets by mouth daily. 05/28/16   Quenten Ravenricia R Brein, MD    Family History No family history on file.  Social History Social History  Substance Use Topics  . Smoking status: Former Smoker    Packs/day: 0.50    Types: Cigarettes    Quit date: 04/24/2016  . Smokeless tobacco: Never Used  . Alcohol use Yes     Comment: 3-4 drinks a week     Allergies   Patient has no known allergies.   Review of Systems Review of Systems  Constitutional: Positive for fever. Negative for appetite change and chills.  Respiratory: Negative for shortness of breath.   Cardiovascular: Negative for chest pain.  Gastrointestinal: Positive  for abdominal pain (Lower b/l crampy abdl pain) and diarrhea. Negative for blood in stool, constipation, nausea and vomiting.  Genitourinary: Positive for hematuria. Negative for difficulty urinating, flank pain, frequency, pelvic pain and vaginal discharge.       Pt reports blood-tinged urine and on "specks" of blood on sanitary napkin.   Musculoskeletal: Negative for back pain.  All other systems reviewed and are negative.    Physical Exam Updated Vital Signs BP (!) 121/59 (BP Location: Right Arm)   Pulse 90   Temp 97.9 F (36.6 C) (Oral)   Resp 16   SpO2 100%   Physical  Exam  Constitutional: She is oriented to person, place, and time. She appears well-developed and well-nourished. No distress.  HENT:  Head: Normocephalic and atraumatic.  Eyes: Conjunctivae are normal.  Neck: Normal range of motion. Neck supple.  Cardiovascular: Normal rate, regular rhythm, normal heart sounds and intact distal pulses.   Pulmonary/Chest: Effort normal and breath sounds normal. She has no wheezes. She has no rales.  Abdominal: Soft. Bowel sounds are normal. She exhibits no distension and no mass. There is tenderness (TTP suprapubic, RLQ, LLQ.). There is no guarding.  Neurological: She is alert and oriented to person, place, and time.  Skin: Skin is warm and dry.  Psychiatric: She has a normal mood and affect. Her behavior is normal.     ED Treatments / Results  Labs (all labs ordered are listed, but only abnormal results are displayed) Labs Reviewed  URINALYSIS, ROUTINE W REFLEX MICROSCOPIC - Abnormal; Notable for the following:       Result Value   APPearance HAZY (*)    All other components within normal limits  PREGNANCY, URINE - Abnormal; Notable for the following:    Preg Test, Ur POSITIVE (*)    All other components within normal limits  POC URINE PREG, ED    EKG  EKG Interpretation None       Radiology No results found.  Procedures Procedures (including critical care time)  Medications Ordered in ED Medications - No data to display   Initial Impression / Assessment and Plan / ED Course  I have reviewed the triage vital signs and the nursing notes.  Pertinent labs & imaging results that were available during my care of the patient were reviewed by me and considered in my medical decision making (see chart for details).     Pt presents w 2week hx crampy abdominal cramping and 2 days hematuria. Pt reports small amount of blood in the toilet after urination and very minimal spotting on sanitary napkin. U/A neg for blood or infection. Urine  pregnancy positive. Ectopic unlikely at this time. Pt is afebrile, VSS, nontoxic, not in distress. Pt had minimal spotting, no severe abd pain. Did not do U/S because it is likely still early in pregnancy. Return precautions given. Discharge w referral to [facility] hospital for follow up.   Pt discussed w Dr. Clayborne Dana, he agrees with plan.   Discussed results, findings, treatment and follow up. Patient advised of return precautions. Patient verbalized understanding and agreed with plan.   Final Clinical Impressions(s) / ED Diagnoses   Final diagnoses:  Pregnancy, unspecified gestational age    [place] Prescriptions New Prescriptions   No medications on file     [place] Mauro Kaufmann, PA-C 01/11/17 1534    [place] Nicole Culbertson, [place] 01/11/17 1536    Marily Memos, MD 01/11/17 [id number]

## 2017-01-11 NOTE — Discharge Instructions (Signed)
Please read instructions below. Please schedule an appointment with an OB/GYN. Return for increased vaginal bleeding, increased abdominal pain with nausea or vomiting.

## 2017-08-03 ENCOUNTER — Encounter (HOSPITAL_COMMUNITY): Payer: Self-pay | Admitting: *Deleted

## 2017-08-03 ENCOUNTER — Inpatient Hospital Stay (HOSPITAL_COMMUNITY)
Admission: AD | Admit: 2017-08-03 | Discharge: 2017-08-03 | Disposition: A | Discharge status: Home / Self Care | Payer: Self-pay | Source: Ambulatory Visit | Attending: Obstetrics and Gynecology | Admitting: Obstetrics and Gynecology

## 2017-08-03 ENCOUNTER — Inpatient Hospital Stay (HOSPITAL_COMMUNITY): Payer: Self-pay

## 2017-08-03 DIAGNOSIS — Z8249 Family history of ischemic heart disease and other diseases of the circulatory system: Secondary | ICD-10-CM | POA: Insufficient documentation

## 2017-08-03 DIAGNOSIS — K529 Noninfective gastroenteritis and colitis, unspecified: Secondary | ICD-10-CM

## 2017-08-03 DIAGNOSIS — F329 Major depressive disorder, single episode, unspecified: Secondary | ICD-10-CM | POA: Insufficient documentation

## 2017-08-03 DIAGNOSIS — O99331 Smoking (tobacco) complicating pregnancy, first trimester: Secondary | ICD-10-CM | POA: Insufficient documentation

## 2017-08-03 DIAGNOSIS — O30031 Twin pregnancy, monochorionic/diamniotic, first trimester: Secondary | ICD-10-CM | POA: Insufficient documentation

## 2017-08-03 DIAGNOSIS — O219 Vomiting of pregnancy, unspecified: Secondary | ICD-10-CM

## 2017-08-03 DIAGNOSIS — Z3A09 9 weeks gestation of pregnancy: Secondary | ICD-10-CM | POA: Insufficient documentation

## 2017-08-03 DIAGNOSIS — O26899 Other specified pregnancy related conditions, unspecified trimester: Secondary | ICD-10-CM

## 2017-08-03 DIAGNOSIS — O34219 Maternal care for unspecified type scar from previous cesarean delivery: Secondary | ICD-10-CM | POA: Insufficient documentation

## 2017-08-03 DIAGNOSIS — Z79899 Other long term (current) drug therapy: Secondary | ICD-10-CM | POA: Insufficient documentation

## 2017-08-03 DIAGNOSIS — F17210 Nicotine dependence, cigarettes, uncomplicated: Secondary | ICD-10-CM | POA: Insufficient documentation

## 2017-08-03 DIAGNOSIS — O99341 Other mental disorders complicating pregnancy, first trimester: Secondary | ICD-10-CM | POA: Insufficient documentation

## 2017-08-03 DIAGNOSIS — O30032 Twin pregnancy, monochorionic/diamniotic, second trimester: Secondary | ICD-10-CM

## 2017-08-03 DIAGNOSIS — O26852 Spotting complicating pregnancy, second trimester: Secondary | ICD-10-CM

## 2017-08-03 DIAGNOSIS — O218 Other vomiting complicating pregnancy: Secondary | ICD-10-CM | POA: Insufficient documentation

## 2017-08-03 DIAGNOSIS — R109 Unspecified abdominal pain: Secondary | ICD-10-CM | POA: Insufficient documentation

## 2017-08-03 DIAGNOSIS — O26891 Other specified pregnancy related conditions, first trimester: Secondary | ICD-10-CM | POA: Insufficient documentation

## 2017-08-03 DIAGNOSIS — O26892 Other specified pregnancy related conditions, second trimester: Secondary | ICD-10-CM

## 2017-08-03 DIAGNOSIS — E860 Dehydration: Secondary | ICD-10-CM

## 2017-08-03 DIAGNOSIS — O26859 Spotting complicating pregnancy, unspecified trimester: Secondary | ICD-10-CM

## 2017-08-03 DIAGNOSIS — Z6791 Unspecified blood type, Rh negative: Secondary | ICD-10-CM

## 2017-08-03 DIAGNOSIS — O161 Unspecified maternal hypertension, first trimester: Secondary | ICD-10-CM | POA: Insufficient documentation

## 2017-08-03 DIAGNOSIS — Z833 Family history of diabetes mellitus: Secondary | ICD-10-CM | POA: Insufficient documentation

## 2017-08-03 LAB — URINALYSIS, ROUTINE W REFLEX MICROSCOPIC
BILIRUBIN URINE: NEGATIVE
GLUCOSE, UA: NEGATIVE mg/dL
Hgb urine dipstick: NEGATIVE
KETONES UR: 20 mg/dL — AB
NITRITE: NEGATIVE
PH: 5 (ref 5.0–8.0)
Protein, ur: 30 mg/dL — AB
SPECIFIC GRAVITY, URINE: 1.031 — AB (ref 1.005–1.030)

## 2017-08-03 LAB — CBC
HCT: 33 % — ABNORMAL LOW (ref 36.0–46.0)
Hemoglobin: 11.1 g/dL — ABNORMAL LOW (ref 12.0–15.0)
MCH: 25.2 pg — AB (ref 26.0–34.0)
MCHC: 33.6 g/dL (ref 30.0–36.0)
MCV: 75 fL — AB (ref 78.0–100.0)
Platelets: 255 10*3/uL (ref 150–400)
RBC: 4.4 MIL/uL (ref 3.87–5.11)
RDW: 14.9 % (ref 11.5–15.5)
WBC: 12.2 10*3/uL — AB (ref 4.0–10.5)

## 2017-08-03 LAB — COMPREHENSIVE METABOLIC PANEL
ALT: 16 U/L (ref 14–54)
AST: 15 U/L (ref 15–41)
Albumin: 3.3 g/dL — ABNORMAL LOW (ref 3.5–5.0)
Alkaline Phosphatase: 56 U/L (ref 38–126)
Anion gap: 9 (ref 5–15)
BUN: 12 mg/dL (ref 6–20)
CHLORIDE: 104 mmol/L (ref 101–111)
CO2: 20 mmol/L — AB (ref 22–32)
CREATININE: 0.41 mg/dL — AB (ref 0.44–1.00)
Calcium: 9.1 mg/dL (ref 8.9–10.3)
GFR calc Af Amer: >60 mL/min (ref >60)
Glucose, Bld: 103 mg/dL — ABNORMAL HIGH (ref 65–99)
Potassium: 3.8 mmol/L (ref 3.5–5.1)
Sodium: 133 mmol/L — ABNORMAL LOW (ref 135–145)
Total Bilirubin: 0.4 mg/dL (ref 0.3–1.2)
Total Protein: 6.9 g/dL (ref 6.5–8.1)

## 2017-08-03 LAB — WET PREP, GENITAL
Clue Cells Wet Prep HPF POC: NONE SEEN
SPERM: NONE SEEN
TRICH WET PREP: NONE SEEN
Yeast Wet Prep HPF POC: NONE SEEN

## 2017-08-03 LAB — HCG, QUANTITATIVE, PREGNANCY: hCG, Beta Chain, Quant, S: 215963 m[IU]/mL — ABNORMAL HIGH (ref <5)

## 2017-08-03 LAB — POCT PREGNANCY, URINE: Preg Test, Ur: POSITIVE — AB

## 2017-08-03 MED ORDER — RANITIDINE HCL 150 MG PO TABS: 150.0000 mg | ORAL_TABLET | Freq: Every day | ORAL | 1 refills | Status: DC

## 2017-08-03 MED ORDER — PROMETHAZINE HCL 25 MG PO TABS: 25.0000 mg | ORAL_TABLET | Freq: Four times a day (QID) | ORAL | 0 refills | Status: DC | PRN

## 2017-08-03 MED ORDER — RHO D IMMUNE GLOBULIN 1500 UNIT/2ML IJ SOSY
300.0000 ug | PREFILLED_SYRINGE | Freq: Once | INTRAMUSCULAR | Status: AC
  Administered 2017-08-03: 300 ug via INTRAMUSCULAR
  Filled 2017-08-03: qty 2

## 2017-08-03 MED ORDER — LACTATED RINGERS IV BOLUS (SEPSIS)
1000.0000 mL | Freq: Once | INTRAVENOUS | Status: AC
  Administered 2017-08-03: 1000 mL via INTRAVENOUS

## 2017-08-03 MED ORDER — PROMETHAZINE HCL 25 MG/ML IJ SOLN
25.0000 mg | Freq: Once | INTRAMUSCULAR | Status: AC
  Administered 2017-08-03: 25 mg via INTRAVENOUS
  Filled 2017-08-03: qty 1

## 2017-08-03 NOTE — MAU Provider Note (Signed)
History     CSN: [id number]  Arrival date and time: 08/03/17 1540   First Provider Initiated Contact with Patient 08/03/17 1615      Chief Complaint  Patient presents with  . Possible Pregnancy  . Emesis  . Abdominal Pain  . Vaginal Bleeding  . Diarrhea   [id number]  presenting with N/V/D, weakness, spotting, and abdominal pain. N/V started last month. She vomits with almost every meal. She is tolerating some liquids. Reports 10 lb weight loss. Diarrhea started 2 days ago. Was watery initially now having loose stools today. No sick contacts. No fevers. No urinary sx except urgency. Abdominal pain started 1 week ago. Describes as lower abdomen, bilateral but worse on LLQ. Took Tylenol but it did not help.     OB History    Gravida Para Term Preterm AB Living   SAB TAB Ectopic Multiple Live Births   1 0   0 2      Past Medical History:  Diagnosis Date  . Depression   . Headache    hx of migraines  . Hypertension    with 2nd pregnancy  . Trichomonas contact     Past Surgical History:  Procedure Laterality Date  . CESAREAN SECTION N/A 05/26/2016   Procedure: CESAREAN SECTION;  Surgeon: Tilda Burrow, MD;  Location: [facility] BIRTHING SUITES;  Service: Obstetrics;  Laterality: N/A;  . NO PAST SURGERIES      Family History  Problem Relation Age of Onset  . Diabetes Mother   . Hypertension Mother     Social History  Substance Use Topics  . Smoking status: Current Some Day Smoker    Packs/day: 0.50    Types: Cigarettes  . Smokeless tobacco: Never Used  . Alcohol use Yes     Comment: 3 months ago as of 08/03/17    Allergies: No Known Allergies  Prescriptions Prior to Admission  Medication Sig Dispense Refill Last Dose  . acetaminophen (TYLENOL) 500 MG tablet Take 500 mg by mouth every 6 (six) hours as needed for moderate pain.   08/03/2017 at Unknown time    Review of Systems  Constitutional: Negative for fever.  Gastrointestinal: Positive for  abdominal pain, diarrhea, nausea and vomiting. Negative for blood in stool.  Genitourinary: Positive for urgency and vaginal bleeding. Negative for dysuria, frequency and hematuria.  Neurological: Positive for weakness.   Physical Exam   Blood pressure 132/81, pulse 95, temperature 97.9 F (36.6 C), temperature source Oral, resp. rate 18, weight 206 lb 1.3 oz (93.5 kg), last menstrual period 05/26/2017, SpO2 99 %, unknown if currently breastfeeding.  Physical Exam  Constitutional: She is oriented to person, place, and time. She appears well-developed and well-nourished.  HENT:  Head: Normocephalic and atraumatic.  Neck: Normal range of motion.  Respiratory: Effort normal. No respiratory distress.  GI: Soft. She exhibits no distension and no mass. There is no tenderness. There is no rebound and no guarding.  Genitourinary:  Genitourinary Comments: External: no lesions or erythema Vagina: rugated, pink, moist, small amt creamy discharge, no blood Uterus: non enlarged, anteverted, non tender, no CMT Adnexae: no masses, no tenderness left, no tenderness right   Musculoskeletal: Normal range of motion.  Neurological: She is alert and oriented to person, place, and time.  Skin: Skin is warm and dry.  Psychiatric: She has a normal mood and affect.   Results for orders placed or performed during the hospital encounter of 08/03/17 (  from the past 24 hour(s))  Urinalysis, Routine w reflex microscopic     Status: Abnormal   Collection Time: 08/03/17  3:45 PM  Result Value Ref Range   Color, Urine AMBER (A) YELLOW   APPearance CLOUDY (A) CLEAR   Specific Gravity, Urine 1.031 (H) 1.005 - 1.030   pH 5.0 5.0 - 8.0   Glucose, UA NEGATIVE NEGATIVE mg/dL   Hgb urine dipstick NEGATIVE NEGATIVE   Bilirubin Urine NEGATIVE NEGATIVE   Ketones, ur 20 (A) NEGATIVE mg/dL   Protein, ur 30 (A) NEGATIVE mg/dL   Nitrite NEGATIVE NEGATIVE   Leukocytes, UA LARGE (A) NEGATIVE   RBC / HPF 6-30 0 - 5 RBC/hpf    WBC, UA TOO NUMEROUS TO COUNT 0 - 5 WBC/hpf   Bacteria, UA FEW (A) NONE SEEN   Squamous Epithelial / LPF 6-30 (A) NONE SEEN   Mucus PRESENT   Pregnancy, urine POC     Status: Abnormal   Collection Time: 08/03/17  3:57 PM  Result Value Ref Range   Preg Test, Ur POSITIVE (A) NEGATIVE  CBC     Status: Abnormal   Collection Time: 08/03/17  4:11 PM  Result Value Ref Range   WBC 12.2 (H) 4.0 - 10.5 K/uL   RBC 4.40 3.87 - 5.11 MIL/uL   Hemoglobin 11.1 (L) 12.0 - 15.0 g/dL   HCT 82.9 (L) 56.2 - 13.0 %   MCV 75.0 (L) 78.0 - 100.0 fL   MCH 25.2 (L) 26.0 - 34.0 pg   MCHC 33.6 30.0 - 36.0 g/dL   RDW 86.5 78.4 - 69.6 %   Platelets 255 150 - 400 K/uL  hCG, quantitative, pregnancy     Status: Abnormal   Collection Time: 08/03/17  4:11 PM  Result Value Ref Range   hCG, Beta Chain, Quant, S 295,284 (H) <5 mIU/mL  Rh IG workup (includes ABO/Rh)     Status: None (Preliminary result)   Collection Time: 08/03/17  4:11 PM  Result Value Ref Range   Gestational Age(Wks) 9    ABO/RH(D) B NEG    Antibody Screen NEG    Unit Number [id number]    Blood Component Type RHIG    Unit division 00    Status of Unit ISSUED    Transfusion Status OK TO TRANSFUSE   Comprehensive metabolic panel     Status: Abnormal   Collection Time: 08/03/17  4:11 PM  Result Value Ref Range   Sodium 133 (L) 135 - 145 mmol/L   Potassium 3.8 3.5 - 5.1 mmol/L   Chloride 104 101 - 111 mmol/L   CO2 20 (L) 22 - 32 mmol/L   Glucose, Bld 103 (H) 65 - 99 mg/dL   BUN 12 6 - 20 mg/dL   Creatinine, Ser 3.66 (L) 0.44 - 1.00 mg/dL   Calcium 9.1 8.9 - 44.0 mg/dL   Total Protein 6.9 6.5 - 8.1 g/dL   Albumin 3.3 (L) 3.5 - 5.0 g/dL   AST 15 15 - 41 U/L   ALT 16 14 - 54 U/L   Alkaline Phosphatase 56 38 - 126 U/L   Total Bilirubin 0.4 0.3 - 1.2 mg/dL   GFR calc non Af Amer >60 >60 mL/min   GFR calc Af Amer >60 >60 mL/min   Anion gap 9 5 - 15  Wet prep, genital     Status: Abnormal   Collection Time: 08/03/17  4:31 PM  Result  Value Ref Range   Yeast Wet Prep HPF POC NONE  SEEN NONE SEEN   Trich, Wet Prep NONE SEEN NONE SEEN   Clue Cells Wet Prep HPF POC NONE SEEN NONE SEEN   WBC, Wet Prep HPF POC MODERATE (A) NONE SEEN   Sperm NONE SEEN    US Ob Comp Less 14 Wks  Result Date: 08/03/2017 CLINICAL DATA:  Cramping, spotting EXAM: TWIN OBSTETRICAL ULTRASOUND <14 WKS COMPARISON:  None. FINDINGS: Number of IUPs:  2 Chorionicity/Amnionicity:  Monochorionic diamniotic TWIN 1 Yolk sac:  Not visualized Embryo:  Visualized Cardiac Activity: Visualized Heart Rate: 161 bpm MSD:   mm    w     d CRL:   75.3  mm   13 w 4 d                  [place] EDC: 02/04/2018 TWIN 2 Yolk sac:  Not visualized Embryo:  Visualized Cardiac Activity: Visualize Heart Rate: 180 bpm MSD:  Mm    w     d CRL:   74.5  mm   13 w 4 d                  [place] EDC: 02/04/2018 Subchorionic hemorrhage:  None visualized. Maternal uterus/adnexae: No adnexal mass or free fluid. IMPRESSION: Monochorionic diamniotic twin pregnancy, approximately 13 weeks 4 days. No acute maternal findings. Electronically Signed   By: Charlett Nose M.D.   On: 08/03/2017 18:14   US Ob Comp Addl Gest Less 14 Wks  Result Date: 08/03/2017 CLINICAL DATA:  Cramping, spotting EXAM: TWIN OBSTETRICAL ULTRASOUND <14 WKS COMPARISON:  None. FINDINGS: Number of IUPs:  2 Chorionicity/Amnionicity:  Monochorionic diamniotic TWIN 1 Yolk sac:  Not visualized Embryo:  Visualized Cardiac Activity: Visualized Heart Rate: 161 bpm MSD:   mm    w     d CRL:   75.3  mm   13 w 4 d                  [place] EDC: 02/04/2018 TWIN 2 Yolk sac:  Not visualized Embryo:  Visualized Cardiac Activity: Visualize Heart Rate: 180 bpm MSD:  Mm    w     d CRL:   74.5  mm   13 w 4 d                  [place] EDC: 02/04/2018 Subchorionic hemorrhage:  None visualized. Maternal uterus/adnexae: No adnexal mass or free fluid. IMPRESSION: Monochorionic diamniotic twin pregnancy, approximately 13 weeks 4 days. No acute maternal findings. Electronically Signed    By: Charlett Nose M.D.   On: 08/03/2017 18:14   MAU Course  Procedures LR 1 L Phenergan Rhogam  MDM Labs and [place] ordered and reviewed. Viable monodi  twin pregnancy on [place]. Nausea improved. No episodes of emesis. Pt planning to start care at [facility] [place] in 2 weeks. Stable for discharge home.  Assessment and Plan   1. Abdominal pain affecting pregnancy   2. Spotting affecting pregnancy   3. Monochorionic diamniotic twin gestation in second trimester   4. Nausea/vomiting in pregnancy   5. Dehydration   6. Gastroenteritis    Discharge home Follow up at [facility] [place] in 1-2 weeks to start care SAB precautions Rx Phenergan Rx Zantac  Allergies as of 08/03/2017   No Known Allergies     Medication List    TAKE these medications   acetaminophen 500 MG tablet Commonly known as:  TYLENOL Take 500 mg by mouth every 6 (six) hours as needed for moderate  pain.   promethazine 25 MG tablet Commonly known as:  PHENERGAN Take 1 tablet (25 mg total) by mouth every 6 (six) hours as needed for nausea or vomiting.   ranitidine 150 MG tablet Commonly known as:  ZANTAC Take 1 tablet (150 mg total) by mouth at bedtime.      Donette Larry, CNM 08/03/2017, 6:32 PM

## 2017-08-03 NOTE — Discharge Instructions (Signed)
Bland Diet A bland diet consists of foods that do not have a lot of fat or fiber. Foods without fat or fiber are easier for the body to digest. They are also less likely to irritate your mouth, throat, stomach, and other parts of your gastrointestinal tract. A bland diet is sometimes called a BRAT diet. What is my plan? Your health care provider or dietitian may recommend specific changes to your diet to prevent and treat your symptoms, such as:  Eating small meals often.  Cooking food until it is soft enough to chew easily.  Chewing your food well.  Drinking fluids slowly.  Not eating foods that are very spicy, sour, or fatty.  Not eating citrus fruits, such as oranges and grapefruit.  What do I need to know about this diet?  Eat a variety of foods from the bland diet food list.  Do not follow a bland diet longer than you have to.  Ask your health care provider whether you should take vitamins. What foods can I eat? Grains  Hot cereals, such as cream of wheat. Bread, crackers, or tortillas made from refined white flour. Rice. Vegetables Canned or cooked vegetables. Mashed or boiled potatoes. Fruits Bananas. Applesauce. Other types of cooked or canned fruit with the skin and seeds removed, such as canned peaches or pears. Meats and Other Protein Sources Scrambled eggs. Creamy peanut butter or other nut butters. Lean, well-cooked meats, such as chicken or fish. Tofu. Soups or broths. Dairy Low-fat dairy products, such as milk, cottage cheese, or yogurt. Beverages Water. Herbal tea. Apple juice. Sweets and Desserts Pudding. Custard. Fruit gelatin. Ice cream. Fats and Oils Mild salad dressings. Canola or olive oil. The items listed above may not be a complete list of allowed foods or beverages. Contact your dietitian for more options. What foods are not recommended? Foods and ingredients that are often not recommended include:  Spicy foods, such as hot sauce or  salsa.  Fried foods.  Sour foods, such as pickled or fermented foods.  Raw vegetables or fruits, especially citrus or berries.  Caffeinated drinks.  Alcohol.  Strongly flavored seasonings or condiments.  The items listed above may not be a complete list of foods and beverages that are not allowed. Contact your dietitian for more information. This information is not intended to replace advice given to you by your health care provider. Make sure you discuss any questions you have with your health care provider. Document Released: 02/02/2016 Document Revised: 03/18/2016 Document Reviewed: 10/23/2014 Elsevier Interactive Patient Education  2018 [employer]. Morning Sickness Morning sickness is when you feel sick to your stomach (nauseous) during pregnancy. You may feel sick to your stomach and throw up (vomit). You may feel sick in the morning, but you can feel this way any time of day. Some women feel very sick to their stomach and cannot stop throwing up (hyperemesis gravidarum). Follow these instructions at home:  Only take medicines as told by your doctor.  Take multivitamins as told by your doctor. Taking multivitamins before getting pregnant can stop or lessen the harshness of morning sickness.  Eat dry toast or unsalted crackers before getting out of bed.  Eat 5 to 6 small meals a day.  Eat dry and bland foods like rice and baked potatoes.  Do not drink liquids with meals. Drink between meals.  Do not eat greasy, fatty, or spicy foods.  Have someone cook for you if the smell of food causes you to feel sick or  throw up.  If you feel sick to your stomach after taking prenatal vitamins, take them at night or with a snack.  Eat protein when you need a snack (nuts, yogurt, cheese).  Eat unsweetened gelatins for dessert.  Wear a bracelet used for sea sickness (acupressure wristband).  Go to a doctor that puts thin needles into certain body points (acupuncture) to improve  how you feel.  Do not smoke.  Use a humidifier to keep the air in your house free of odors.  Get lots of fresh air. Contact a doctor if:  You need medicine to feel better.  You feel dizzy or lightheaded.  You are losing weight. Get help right away if:  You feel very sick to your stomach and cannot stop throwing up.  You pass out (faint). This information is not intended to replace advice given to you by your health care provider. Make sure you discuss any questions you have with your health care provider. Document Released: 11/18/2004 Document Revised: 03/18/2016 Document Reviewed: 03/28/2013 Elsevier Interactive Patient Education  2017 [employer].

## 2017-08-03 NOTE — Progress Notes (Signed)
Unsuccessful attempts by Doreatha Massed, RNC

## 2017-08-03 NOTE — MAU Note (Signed)
+  HPT LMP 05/26/17  +vomiting x3-4 times a day; started noticing blood the past week +diarrhea x6 patient reports  +lower abdominal cramping Rating 6/10 Has tried tylenol with no relief Intermittent pain X 86month  +vaginal bleeding Spotting in nature Light red

## 2017-08-03 NOTE — MAU Note (Signed)
Pt presents with N/V/D that began 5 days ago.  Pt reports emesis 3-4 times in past 24 hours & has had 5 episodes diarrhea.  Pt states she has scant amount blood in emesis that began 2 days ago.

## 2017-08-04 LAB — GC/CHLAMYDIA PROBE AMP (~~LOC~~) NOT AT ARMC
Chlamydia: NEGATIVE
Neisseria Gonorrhea: NEGATIVE

## 2017-08-04 LAB — RH IG WORKUP (INCLUDES ABO/RH)
ABO/RH(D): B NEG
Antibody Screen: NEGATIVE
GESTATIONAL AGE(WKS): 9
Unit division: 0

## 2017-11-19 ENCOUNTER — Encounter (HOSPITAL_COMMUNITY): Payer: Self-pay

## 2017-11-19 ENCOUNTER — Inpatient Hospital Stay (HOSPITAL_COMMUNITY)
Admission: AD | Admit: 2017-11-19 | Discharge: 2017-11-19 | Disposition: A | Discharge status: Home / Self Care | Payer: Medicaid Other | Source: Ambulatory Visit | Attending: Obstetrics & Gynecology | Admitting: Obstetrics & Gynecology

## 2017-11-19 DIAGNOSIS — O0933 Supervision of pregnancy with insufficient antenatal care, third trimester: Secondary | ICD-10-CM

## 2017-11-19 DIAGNOSIS — O30033 Twin pregnancy, monochorionic/diamniotic, third trimester: Secondary | ICD-10-CM

## 2017-11-19 DIAGNOSIS — R609 Edema, unspecified: Secondary | ICD-10-CM | POA: Diagnosis not present

## 2017-11-19 DIAGNOSIS — Z3A29 29 weeks gestation of pregnancy: Secondary | ICD-10-CM | POA: Insufficient documentation

## 2017-11-19 DIAGNOSIS — O10013 Pre-existing essential hypertension complicating pregnancy, third trimester: Secondary | ICD-10-CM | POA: Insufficient documentation

## 2017-11-19 DIAGNOSIS — D508 Other iron deficiency anemias: Secondary | ICD-10-CM | POA: Diagnosis not present

## 2017-11-19 DIAGNOSIS — N898 Other specified noninflammatory disorders of vagina: Secondary | ICD-10-CM

## 2017-11-19 DIAGNOSIS — O898 Other complications of anesthesia during the puerperium: Secondary | ICD-10-CM | POA: Diagnosis not present

## 2017-11-19 DIAGNOSIS — O26893 Other specified pregnancy related conditions, third trimester: Secondary | ICD-10-CM | POA: Diagnosis not present

## 2017-11-19 DIAGNOSIS — O10913 Unspecified pre-existing hypertension complicating pregnancy, third trimester: Secondary | ICD-10-CM

## 2017-11-19 DIAGNOSIS — Z87891 Personal history of nicotine dependence: Secondary | ICD-10-CM | POA: Insufficient documentation

## 2017-11-19 HISTORY — DX: Anemia, unspecified: D64.9

## 2017-11-19 LAB — CBC WITH DIFFERENTIAL/PLATELET
BASOS ABS: 0 10*3/uL (ref 0.0–0.1)
Basophils Relative: 0 %
EOS ABS: 0.1 10*3/uL (ref 0.0–0.7)
Eosinophils Relative: 1 %
HCT: 23.2 % — ABNORMAL LOW (ref 36.0–46.0)
HEMOGLOBIN: 7.5 g/dL — AB (ref 12.0–15.0)
LYMPHS PCT: 20 %
Lymphs Abs: 2.2 10*3/uL (ref 0.7–4.0)
MCH: 23.9 pg — ABNORMAL LOW (ref 26.0–34.0)
MCHC: 32.3 g/dL (ref 30.0–36.0)
MCV: 73.9 fL — ABNORMAL LOW (ref 78.0–100.0)
MONO ABS: 0.6 10*3/uL (ref 0.1–1.0)
MONOS PCT: 5 %
NEUTROS PCT: 74 %
Neutro Abs: 8.1 10*3/uL — ABNORMAL HIGH (ref 1.7–7.7)
Other: 0 %
Platelets: 253 10*3/uL (ref 150–400)
RBC: 3.14 MIL/uL — AB (ref 3.87–5.11)
RDW: 14.5 % (ref 11.5–15.5)
WBC: 11 10*3/uL — AB (ref 4.0–10.5)

## 2017-11-19 LAB — COMPREHENSIVE METABOLIC PANEL
ALBUMIN: 2.7 g/dL — AB (ref 3.5–5.0)
ALK PHOS: 84 U/L (ref 38–126)
ALT: 9 U/L — AB (ref 14–54)
AST: 15 U/L (ref 15–41)
Anion gap: 9 (ref 5–15)
BUN: 6 mg/dL (ref 6–20)
CALCIUM: 8.3 mg/dL — AB (ref 8.9–10.3)
CO2: 19 mmol/L — AB (ref 22–32)
Chloride: 107 mmol/L (ref 101–111)
Creatinine, Ser: 0.42 mg/dL — ABNORMAL LOW (ref 0.44–1.00)
GFR calc Af Amer: >60 mL/min (ref >60)
GFR calc non Af Amer: >60 mL/min (ref >60)
GLUCOSE: 91 mg/dL (ref 65–99)
Potassium: 3.6 mmol/L (ref 3.5–5.1)
SODIUM: 135 mmol/L (ref 135–145)
Total Bilirubin: 0.5 mg/dL (ref 0.3–1.2)
Total Protein: 6.2 g/dL — ABNORMAL LOW (ref 6.5–8.1)

## 2017-11-19 LAB — URINALYSIS, ROUTINE W REFLEX MICROSCOPIC
Bilirubin Urine: NEGATIVE
GLUCOSE, UA: NEGATIVE mg/dL
HGB URINE DIPSTICK: NEGATIVE
Ketones, ur: NEGATIVE mg/dL
NITRITE: NEGATIVE
Protein, ur: 30 mg/dL — AB
SPECIFIC GRAVITY, URINE: 1.023 (ref 1.005–1.030)
pH: 6 (ref 5.0–8.0)

## 2017-11-19 LAB — RAPID URINE DRUG SCREEN, HOSP PERFORMED
AMPHETAMINES: NOT DETECTED
BENZODIAZEPINES: NOT DETECTED
Barbiturates: NOT DETECTED
Cocaine: NOT DETECTED
OPIATES: NOT DETECTED
Tetrahydrocannabinol: POSITIVE — AB

## 2017-11-19 LAB — WET PREP, GENITAL
CLUE CELLS WET PREP: NONE SEEN
SPERM: NONE SEEN
Trich, Wet Prep: NONE SEEN
YEAST WET PREP: NONE SEEN

## 2017-11-19 LAB — HEMOGLOBIN A1C
Hgb A1c MFr Bld: 6.2 % — ABNORMAL HIGH (ref 4.8–5.6)
Mean Plasma Glucose: 131.24 mg/dL

## 2017-11-19 LAB — PROTEIN / CREATININE RATIO, URINE
Creatinine, Urine: 206 mg/dL
PROTEIN CREATININE RATIO: 0.22 mg/mg{creat} — AB (ref 0.00–0.15)
Total Protein, Urine: 46 mg/dL

## 2017-11-19 MED ORDER — CEPHALEXIN 500 MG PO CAPS: 500.0000 mg | ORAL_CAPSULE | Freq: Four times a day (QID) | ORAL | 0 refills | Status: DC

## 2017-11-19 MED ORDER — FERROUS SULFATE 325 (65 FE) MG PO TABS: 325.0000 mg | ORAL_TABLET | Freq: Two times a day (BID) | ORAL | 3 refills | Status: DC

## 2017-11-19 MED ORDER — LABETALOL HCL 100 MG PO TABS
200.0000 mg | ORAL_TABLET | Freq: Two times a day (BID) | ORAL | Status: DC
  Administered 2017-11-19: 200 mg via ORAL
  Filled 2017-11-19: qty 2

## 2017-11-19 MED ORDER — RHO D IMMUNE GLOBULIN 1500 UNIT/2ML IJ SOSY
300.0000 ug | PREFILLED_SYRINGE | Freq: Once | INTRAMUSCULAR | Status: AC
  Administered 2017-11-19: 300 ug via INTRAMUSCULAR
  Filled 2017-11-19: qty 2

## 2017-11-19 MED ORDER — CEPHALEXIN 500 MG PO CAPS
500.0000 mg | ORAL_CAPSULE | Freq: Once | ORAL | Status: AC
  Administered 2017-11-19: 500 mg via ORAL
  Filled 2017-11-19: qty 1

## 2017-11-19 MED ORDER — LABETALOL HCL 200 MG PO TABS: 200.0000 mg | ORAL_TABLET | Freq: Two times a day (BID) | ORAL | 5 refills | Status: DC

## 2017-11-19 NOTE — MAU Provider Note (Signed)
History   [id number] @ 29 wks in with c/o vaginal discharge for several weeks now that itches and swelling of left jaw that is painful to touch. Pt states this has been going on for 3 days. She states ice helps with swelling.  CSN: [id number]  Arrival date & time 11/19/17  1729   None     Chief Complaint  Patient presents with  . Vaginal Discharge    HPI  Past Medical History:  Diagnosis Date  . Depression   . Headache    hx of migraines  . Hypertension    with 2nd pregnancy  . Trichomonas contact     Past Surgical History:  Procedure Laterality Date  . CESAREAN SECTION N/A 05/26/2016   Procedure: CESAREAN SECTION;  Surgeon: Tilda BurrowJohn V Ferguson, MD;  [facility];  Service: Obstetrics;  Laterality: N/A;    Family History  Problem Relation Age of Onset  . Diabetes Mother   . Hypertension Mother     Social History   Tobacco Use  . Smoking status: Former Smoker    Packs/day: 0.50    Types: Cigarettes    Last attempt to quit: 07/2017    Years since quitting: 0.3  . Smokeless tobacco: Never Used  Substance Use Topics  . Alcohol use: Yes    Comment: 3 months ago as of 08/03/17  . Drug use: Yes    Types: Marijuana    Comment: 3 weeks ago as of 08/03/17    OB History    Gravida Para Term Preterm AB Living   4 2 2   1 2    SAB TAB Ectopic Multiple Live Births   1 0   0 2      Review of Systems  Constitutional: Negative.   HENT: Positive for facial swelling.   Eyes: Negative.   Respiratory: Negative.   Cardiovascular: Negative.   Gastrointestinal: Negative.   Endocrine: Negative.   Genitourinary: Positive for vaginal discharge.  Musculoskeletal: Negative.   Skin: Negative.   Allergic/Immunologic: Negative.   Neurological: Negative.   Hematological: Negative.   Psychiatric/Behavioral: Negative.     Allergies  Patient has no known allergies.  Home Medications    BP 126/81   Pulse (!) 114   Temp 98.5 F (36.9 C)   Resp 18   Ht 5\' 5"   (1.651 m)   Wt 233 lb (105.7 kg)   LMP 05/26/2017 (Exact Date)   SpO2 98%   BMI 38.77 kg/m   Physical Exam  Constitutional: She is oriented to person, place, and time. She appears well-developed and well-nourished.  HENT:  Head: Normocephalic.  Cardiovascular: Normal rate, regular rhythm, normal heart sounds and intact distal pulses.  Pulmonary/Chest: Effort normal and breath sounds normal.  Abdominal: Soft. Bowel sounds are normal.  Genitourinary: Vaginal discharge found.  Musculoskeletal: Normal range of motion.  Neurological: She is alert and oriented to person, place, and time. She has normal reflexes.  Skin: Skin is warm and dry.  Psychiatric: She has a normal mood and affect. Her behavior is normal. Judgment and thought content normal.    MAU Course  Procedures (including critical care time)  Labs Reviewed  WET PREP, GENITAL  URINALYSIS, ROUTINE W REFLEX MICROSCOPIC  CBC WITH DIFFERENTIAL/PLATELET  COMPREHENSIVE METABOLIC PANEL  PROTEIN / CREATININE RATIO, URINE  RAPID URINE DRUG SCREEN, HOSP PERFORMED  HEPATITIS B SURFACE ANTIGEN  RUBELLA SCREEN  RPR  HIV ANTIBODY (ROUTINE TESTING)  HEMOGLOBIN A1C  RH IG WORKUP (INCLUDES ABO/RH)  GC/CHLAMYDIA PROBE AMP ([place]) NOT AT [facility]   No results found. Results for orders placed or performed during the hospital encounter of 11/19/17 (from the past 24 hour(s))  Urinalysis, Routine w reflex microscopic     Status: Abnormal   Collection Time: 11/19/17  5:40 PM  Result Value Ref Range   Color, Urine YELLOW YELLOW   APPearance CLOUDY (A) CLEAR   Specific Gravity, Urine 1.023 1.005 - 1.030   pH 6.0 5.0 - 8.0   Glucose, UA NEGATIVE NEGATIVE mg/dL   Hgb urine dipstick NEGATIVE NEGATIVE   Bilirubin Urine NEGATIVE NEGATIVE   Ketones, ur NEGATIVE NEGATIVE mg/dL   Protein, ur 30 (A) NEGATIVE mg/dL   Nitrite NEGATIVE NEGATIVE   Leukocytes, UA MODERATE (A) NEGATIVE   RBC / HPF 6-30 0 - 5 RBC/hpf   WBC, UA TOO NUMEROUS TO  COUNT 0 - 5 WBC/hpf   Bacteria, UA MANY (A) NONE SEEN   Squamous Epithelial / LPF 6-30 (A) NONE SEEN   Mucus PRESENT   Protein / creatinine ratio, urine     Status: Abnormal   Collection Time: 11/19/17  5:40 PM  Result Value Ref Range   Creatinine, Urine 206.00 mg/dL   Total Protein, Urine 46 mg/dL   Protein Creatinine Ratio 0.22 (H) 0.00 - 0.15 mg/mg[Cre]  Urine rapid drug screen (hosp performed)     Status: Abnormal   Collection Time: 11/19/17  5:40 PM  Result Value Ref Range   Opiates NONE DETECTED NONE DETECTED   Cocaine NONE DETECTED NONE DETECTED   Benzodiazepines NONE DETECTED NONE DETECTED   Amphetamines NONE DETECTED NONE DETECTED   Tetrahydrocannabinol POSITIVE (A) NONE DETECTED   Barbiturates NONE DETECTED NONE DETECTED  Wet prep, genital     Status: Abnormal   Collection Time: 11/19/17  5:55 PM  Result Value Ref Range   Yeast Wet Prep HPF POC NONE SEEN NONE SEEN   Trich, Wet Prep NONE SEEN NONE SEEN   Clue Cells Wet Prep HPF POC NONE SEEN NONE SEEN   WBC, Wet Prep HPF POC FEW (A) NONE SEEN   Sperm NONE SEEN     1. Chronic hypertension in obstetric context in third trimester   2. No prenatal care in current pregnancy in third trimester   3. Vaginal discharge during pregnancy in third trimester   4. Monochorionic diamniotic twin gestation in third trimester   5. Parotid swelling       MDM  BP sl elevated , left side of lower jaw swollen and tender to touch. FHR's tracing are reassuring, no decels. Wet prep neg, lengthy discussion with pt regarding her high risk pregnancy, not getting care and taking her BP meds as prescribed. POC discussed with Dr. Macon Large. Will start keflex 500 QID, and labetolol 200 BID, FESO4 bid. Message sent to clinic to get pt in for care.

## 2017-11-19 NOTE — MAU Note (Signed)
Pt is having swelling under her left chin. Slight cough. Also thinks she may have a yeast infection. Has not gotten care since October because she says her pregnancy medicaid has not gone through.

## 2017-11-19 NOTE — Discharge Instructions (Signed)

## 2017-11-20 LAB — RH IG WORKUP (INCLUDES ABO/RH)
ABO/RH(D): B NEG
Antibody Screen: NEGATIVE
Fetal Screen: NEGATIVE
GESTATIONAL AGE(WKS): 29
Unit division: 0

## 2017-11-20 LAB — HIV ANTIBODY (ROUTINE TESTING W REFLEX): HIV SCREEN 4TH GENERATION: NONREACTIVE

## 2017-11-20 LAB — RUBELLA SCREEN: Rubella: <0.9 index — ABNORMAL LOW (ref >0.99)

## 2017-11-20 LAB — RPR: RPR: NONREACTIVE

## 2017-11-20 LAB — HEPATITIS B SURFACE ANTIGEN: Hepatitis B Surface Ag: NEGATIVE

## 2017-11-21 LAB — URINE CULTURE

## 2017-11-21 LAB — GC/CHLAMYDIA PROBE AMP (~~LOC~~) NOT AT ARMC
Chlamydia: NEGATIVE
Neisseria Gonorrhea: NEGATIVE

## 2017-11-22 ENCOUNTER — Inpatient Hospital Stay (EMERGENCY_DEPARTMENT_HOSPITAL)
Admission: AD | Admit: 2017-11-22 | Discharge: 2017-11-22 | Disposition: A | Discharge status: Home / Self Care | Payer: Medicaid Other | Source: Ambulatory Visit | Attending: Obstetrics and Gynecology | Admitting: Obstetrics and Gynecology

## 2017-11-22 ENCOUNTER — Encounter (HOSPITAL_COMMUNITY): Payer: Self-pay

## 2017-11-22 ENCOUNTER — Inpatient Hospital Stay (HOSPITAL_COMMUNITY): Payer: Medicaid Other | Admitting: Anesthesiology

## 2017-11-22 ENCOUNTER — Encounter (HOSPITAL_COMMUNITY): Payer: Self-pay | Admitting: *Deleted

## 2017-11-22 ENCOUNTER — Inpatient Hospital Stay (HOSPITAL_COMMUNITY)
Admission: AD | Admit: 2017-11-22 | Discharge: 2017-11-25 | DRG: 786 | Disposition: A | Discharge status: Home / Self Care | Payer: Medicaid Other | Source: Ambulatory Visit | Attending: Obstetrics and Gynecology | Admitting: Obstetrics and Gynecology

## 2017-11-22 ENCOUNTER — Encounter (HOSPITAL_COMMUNITY)
Admission: AD | Disposition: A | Discharge status: Home / Self Care | Payer: Self-pay | Source: Ambulatory Visit | Attending: Obstetrics and Gynecology

## 2017-11-22 DIAGNOSIS — O34212 Maternal care for vertical scar from previous cesarean delivery: Secondary | ICD-10-CM | POA: Diagnosis present

## 2017-11-22 DIAGNOSIS — O26893 Other specified pregnancy related conditions, third trimester: Secondary | ICD-10-CM

## 2017-11-22 DIAGNOSIS — O30033 Twin pregnancy, monochorionic/diamniotic, third trimester: Secondary | ICD-10-CM | POA: Diagnosis present

## 2017-11-22 DIAGNOSIS — O0933 Supervision of pregnancy with insufficient antenatal care, third trimester: Secondary | ICD-10-CM | POA: Insufficient documentation

## 2017-11-22 DIAGNOSIS — R102 Pelvic and perineal pain: Secondary | ICD-10-CM

## 2017-11-22 DIAGNOSIS — Z6791 Unspecified blood type, Rh negative: Secondary | ICD-10-CM | POA: Diagnosis not present

## 2017-11-22 DIAGNOSIS — Z3A29 29 weeks gestation of pregnancy: Secondary | ICD-10-CM

## 2017-11-22 DIAGNOSIS — O26899 Other specified pregnancy related conditions, unspecified trimester: Secondary | ICD-10-CM

## 2017-11-22 DIAGNOSIS — O9902 Anemia complicating childbirth: Secondary | ICD-10-CM | POA: Diagnosis present

## 2017-11-22 DIAGNOSIS — D649 Anemia, unspecified: Secondary | ICD-10-CM | POA: Diagnosis present

## 2017-11-22 DIAGNOSIS — O99324 Drug use complicating childbirth: Secondary | ICD-10-CM | POA: Diagnosis present

## 2017-11-22 DIAGNOSIS — O09899 Supervision of other high risk pregnancies, unspecified trimester: Secondary | ICD-10-CM

## 2017-11-22 DIAGNOSIS — O30003 Twin pregnancy, unspecified number of placenta and unspecified number of amniotic sacs, third trimester: Secondary | ICD-10-CM

## 2017-11-22 DIAGNOSIS — O98813 Other maternal infectious and parasitic diseases complicating pregnancy, third trimester: Secondary | ICD-10-CM

## 2017-11-22 DIAGNOSIS — O321XX1 Maternal care for breech presentation, fetus 1: Secondary | ICD-10-CM

## 2017-11-22 DIAGNOSIS — B373 Candidiasis of vulva and vagina: Secondary | ICD-10-CM | POA: Insufficient documentation

## 2017-11-22 DIAGNOSIS — Z98891 History of uterine scar from previous surgery: Secondary | ICD-10-CM

## 2017-11-22 DIAGNOSIS — O328XX2 Maternal care for other malpresentation of fetus, fetus 2: Principal | ICD-10-CM | POA: Diagnosis present

## 2017-11-22 DIAGNOSIS — O9989 Other specified diseases and conditions complicating pregnancy, childbirth and the puerperium: Secondary | ICD-10-CM

## 2017-11-22 DIAGNOSIS — O99810 Abnormal glucose complicating pregnancy: Secondary | ICD-10-CM | POA: Insufficient documentation

## 2017-11-22 DIAGNOSIS — Z87891 Personal history of nicotine dependence: Secondary | ICD-10-CM

## 2017-11-22 DIAGNOSIS — Z283 Underimmunization status: Secondary | ICD-10-CM

## 2017-11-22 DIAGNOSIS — B3731 Acute candidiasis of vulva and vagina: Secondary | ICD-10-CM

## 2017-11-22 DIAGNOSIS — F1210 Cannabis abuse, uncomplicated: Secondary | ICD-10-CM | POA: Diagnosis present

## 2017-11-22 DIAGNOSIS — O321XX2 Maternal care for breech presentation, fetus 2: Secondary | ICD-10-CM

## 2017-11-22 LAB — CBC
HEMATOCRIT: 23.2 % — AB (ref 36.0–46.0)
HEMOGLOBIN: 7.3 g/dL — AB (ref 12.0–15.0)
MCH: 23.3 pg — AB (ref 26.0–34.0)
MCHC: 31.5 g/dL (ref 30.0–36.0)
MCV: 74.1 fL — AB (ref 78.0–100.0)
Platelets: 300 10*3/uL (ref 150–400)
RBC: 3.13 MIL/uL — ABNORMAL LOW (ref 3.87–5.11)
RDW: 14.8 % (ref 11.5–15.5)
WBC: 11.5 10*3/uL — ABNORMAL HIGH (ref 4.0–10.5)

## 2017-11-22 LAB — DIFFERENTIAL
BASOS ABS: 0 10*3/uL (ref 0.0–0.1)
Band Neutrophils: 0 %
Basophils Relative: 0 %
Blasts: 0 %
EOS ABS: 0 10*3/uL (ref 0.0–0.7)
EOS PCT: 0 %
LYMPHS ABS: 1.6 10*3/uL (ref 0.7–4.0)
Lymphocytes Relative: 14 %
METAMYELOCYTES PCT: 0 %
MONO ABS: 0.8 10*3/uL (ref 0.1–1.0)
MYELOCYTES: 0 %
Monocytes Relative: 7 %
NEUTROS ABS: 9.1 10*3/uL — AB (ref 1.7–7.7)
Neutrophils Relative %: 79 %
Other: 0 %
PROMYELOCYTES ABS: 0 %
nRBC: 0 /100 WBC

## 2017-11-22 LAB — RAPID HIV SCREEN (HIV 1/2 AB+AG)
HIV 1/2 ANTIBODIES: NONREACTIVE
HIV-1 P24 Antigen - HIV24: NONREACTIVE

## 2017-11-22 LAB — RAPID URINE DRUG SCREEN, HOSP PERFORMED
Amphetamines: NOT DETECTED
BARBITURATES: NOT DETECTED
BENZODIAZEPINES: NOT DETECTED
Cocaine: NOT DETECTED
Opiates: NOT DETECTED
Tetrahydrocannabinol: POSITIVE — AB

## 2017-11-22 LAB — PREPARE RBC (CROSSMATCH)

## 2017-11-22 SURGERY — Surgical Case
Anesthesia: Spinal

## 2017-11-22 MED ORDER — PHENYLEPHRINE 8 MG IN D5W 100 ML (0.08MG/ML) PREMIX OPTIME
INJECTION | INTRAVENOUS | Status: AC
  Filled 2017-11-22: qty 100

## 2017-11-22 MED ORDER — SCOPOLAMINE 1 MG/3DAYS TD PT72
MEDICATED_PATCH | TRANSDERMAL | Status: AC
  Filled 2017-11-22: qty 1

## 2017-11-22 MED ORDER — SODIUM CHLORIDE 0.9 % IV SOLN
Freq: Once | INTRAVENOUS | Status: AC
  Administered 2017-11-23: 01:00:00 via INTRAVENOUS

## 2017-11-22 MED ORDER — LACTATED RINGERS IV SOLN: INTRAVENOUS | Status: DC

## 2017-11-22 MED ORDER — DOCUSATE SODIUM 100 MG PO CAPS: 100.0000 mg | ORAL_CAPSULE | Freq: Every day | ORAL | Status: DC

## 2017-11-22 MED ORDER — DIPHENHYDRAMINE HCL 50 MG/ML IJ SOLN
INTRAMUSCULAR | Status: AC
  Filled 2017-11-22: qty 1

## 2017-11-22 MED ORDER — SODIUM CHLORIDE 0.9 % IV SOLN
INTRAVENOUS | Status: DC | PRN
  Administered 2017-11-22: 22:00:00 via INTRAVENOUS

## 2017-11-22 MED ORDER — FAMOTIDINE IN NACL 20-0.9 MG/50ML-% IV SOLN
INTRAVENOUS | Status: AC
  Administered 2017-11-22: 21:00:00
  Filled 2017-11-22: qty 50

## 2017-11-22 MED ORDER — KETOROLAC TROMETHAMINE 30 MG/ML IJ SOLN: 30.0000 mg | Freq: Once | INTRAMUSCULAR | Status: DC | PRN

## 2017-11-22 MED ORDER — FENTANYL CITRATE (PF) 100 MCG/2ML IJ SOLN
100.0000 ug | Freq: Once | INTRAMUSCULAR | Status: AC
  Administered 2017-11-22: 100 ug via INTRAMUSCULAR
  Filled 2017-11-22: qty 2

## 2017-11-22 MED ORDER — SOD CITRATE-CITRIC ACID 500-334 MG/5ML PO SOLN: 30.0000 mL | ORAL | Status: DC

## 2017-11-22 MED ORDER — PHENYLEPHRINE 40 MCG/ML (10ML) SYRINGE FOR IV PUSH (FOR BLOOD PRESSURE SUPPORT)
PREFILLED_SYRINGE | INTRAVENOUS | Status: AC
  Filled 2017-11-22: qty 10

## 2017-11-22 MED ORDER — SODIUM CHLORIDE 0.9 % IV SOLN: Freq: Once | INTRAVENOUS | Status: DC

## 2017-11-22 MED ORDER — FENTANYL CITRATE (PF) 100 MCG/2ML IJ SOLN
INTRAMUSCULAR | Status: AC
  Filled 2017-11-22: qty 2

## 2017-11-22 MED ORDER — PRENATAL MULTIVITAMIN CH: 1.0000 | ORAL_TABLET | Freq: Every day | ORAL | Status: DC

## 2017-11-22 MED ORDER — RHO D IMMUNE GLOBULIN 1500 UNIT/2ML IJ SOSY
300.0000 ug | PREFILLED_SYRINGE | Freq: Once | INTRAMUSCULAR | Status: DC
  Filled 2017-11-22: qty 2

## 2017-11-22 MED ORDER — MEPERIDINE HCL 25 MG/ML IJ SOLN: 6.2500 mg | INTRAMUSCULAR | Status: DC | PRN

## 2017-11-22 MED ORDER — MORPHINE SULFATE (PF) 0.5 MG/ML IJ SOLN
INTRAMUSCULAR | Status: AC
  Filled 2017-11-22: qty 10

## 2017-11-22 MED ORDER — PHENYLEPHRINE 8 MG IN D5W 100 ML (0.08MG/ML) PREMIX OPTIME
INJECTION | INTRAVENOUS | Status: DC | PRN
  Administered 2017-11-22: 60 ug/min via INTRAVENOUS

## 2017-11-22 MED ORDER — ONDANSETRON HCL 4 MG/2ML IJ SOLN
INTRAMUSCULAR | Status: AC
  Filled 2017-11-22: qty 2

## 2017-11-22 MED ORDER — ONDANSETRON HCL 4 MG/2ML IJ SOLN
INTRAMUSCULAR | Status: DC | PRN
  Administered 2017-11-22: 4 mg via INTRAVENOUS

## 2017-11-22 MED ORDER — OXYTOCIN 10 UNIT/ML IJ SOLN
INTRAMUSCULAR | Status: AC
  Filled 2017-11-22: qty 4

## 2017-11-22 MED ORDER — ACETAMINOPHEN 325 MG PO TABS: 650.0000 mg | ORAL_TABLET | ORAL | Status: DC | PRN

## 2017-11-22 MED ORDER — BETAMETHASONE SOD PHOS & ACET 6 (3-3) MG/ML IJ SUSP
12.0000 mg | Freq: Once | INTRAMUSCULAR | Status: DC
  Filled 2017-11-22: qty 2

## 2017-11-22 MED ORDER — PROMETHAZINE HCL 25 MG/ML IJ SOLN: 6.2500 mg | INTRAMUSCULAR | Status: DC | PRN

## 2017-11-22 MED ORDER — SCOPOLAMINE 1 MG/3DAYS TD PT72
MEDICATED_PATCH | TRANSDERMAL | Status: DC | PRN
  Administered 2017-11-22: 1 via TRANSDERMAL

## 2017-11-22 MED ORDER — TERCONAZOLE 0.4 % VA CREA: 1.0000 | TOPICAL_CREAM | Freq: Every day | VAGINAL | 0 refills | Status: DC

## 2017-11-22 MED ORDER — ZOLPIDEM TARTRATE 5 MG PO TABS: 5.0000 mg | ORAL_TABLET | Freq: Every evening | ORAL | Status: DC | PRN

## 2017-11-22 MED ORDER — CALCIUM CARBONATE ANTACID 500 MG PO CHEW: 2.0000 | CHEWABLE_TABLET | ORAL | Status: DC | PRN

## 2017-11-22 MED ORDER — PHENYLEPHRINE 40 MCG/ML (10ML) SYRINGE FOR IV PUSH (FOR BLOOD PRESSURE SUPPORT)
PREFILLED_SYRINGE | INTRAVENOUS | Status: DC | PRN
  Administered 2017-11-22: 80 ug via INTRAVENOUS

## 2017-11-22 MED ORDER — SOD CITRATE-CITRIC ACID 500-334 MG/5ML PO SOLN
ORAL | Status: AC
  Administered 2017-11-22: 21:00:00
  Filled 2017-11-22: qty 15

## 2017-11-22 MED ORDER — CEFAZOLIN SODIUM-DEXTROSE 2-3 GM-%(50ML) IV SOLR
INTRAVENOUS | Status: DC | PRN
  Administered 2017-11-22: 2 g via INTRAVENOUS

## 2017-11-22 MED ORDER — DIPHENHYDRAMINE HCL 50 MG/ML IJ SOLN
INTRAMUSCULAR | Status: DC | PRN
  Administered 2017-11-22 (×2): 25 mg via INTRAVENOUS

## 2017-11-22 MED ORDER — CEFAZOLIN SODIUM-DEXTROSE 2-4 GM/100ML-% IV SOLN
INTRAVENOUS | Status: AC
  Filled 2017-11-22: qty 100

## 2017-11-22 MED ORDER — NIFEDIPINE 10 MG PO CAPS
10.0000 mg | ORAL_CAPSULE | ORAL | Status: DC | PRN
  Administered 2017-11-22: 10 mg via ORAL
  Filled 2017-11-22 (×2): qty 1

## 2017-11-22 MED ORDER — TRANEXAMIC ACID 1000 MG/10ML IV SOLN
1000.0000 mg | INTRAVENOUS | Status: AC
  Administered 2017-11-22: 1000 mg via INTRAVENOUS
  Filled 2017-11-22: qty 10

## 2017-11-22 MED ORDER — BUPIVACAINE HCL (PF) 0.5 % IJ SOLN
INTRAMUSCULAR | Status: AC
  Filled 2017-11-22: qty 30

## 2017-11-22 MED ORDER — BUPIVACAINE IN DEXTROSE 0.75-8.25 % IT SOLN
INTRATHECAL | Status: DC | PRN
  Administered 2017-11-22: 10.5 mg via INTRATHECAL

## 2017-11-22 MED ORDER — CEFAZOLIN SODIUM-DEXTROSE 2-4 GM/100ML-% IV SOLN: 2.0000 g | INTRAVENOUS | Status: DC

## 2017-11-22 MED ORDER — AZITHROMYCIN 500 MG IV SOLR
1000.0000 mg | INTRAVENOUS | Status: AC
  Administered 2017-11-22: 1000 mg via INTRAVENOUS
  Filled 2017-11-22: qty 1000

## 2017-11-22 MED ORDER — LACTATED RINGERS IV SOLN
INTRAVENOUS | Status: DC
  Administered 2017-11-22: 999 mL/h via INTRAVENOUS

## 2017-11-22 MED ORDER — HYDROMORPHONE HCL 1 MG/ML IJ SOLN
INTRAMUSCULAR | Status: AC
  Administered 2017-11-22: 0.5 mg via INTRAVENOUS
  Filled 2017-11-22: qty 0.5

## 2017-11-22 MED ORDER — CLINDAMYCIN PHOSPHATE 900 MG/50ML IV SOLN: 900.0000 mg | Freq: Three times a day (TID) | INTRAVENOUS | Status: DC

## 2017-11-22 MED ORDER — MORPHINE SULFATE (PF) 0.5 MG/ML IJ SOLN
INTRAMUSCULAR | Status: DC | PRN
  Administered 2017-11-22: .2 mg via INTRATHECAL

## 2017-11-22 MED ORDER — PROPOFOL 10 MG/ML IV BOLUS
INTRAVENOUS | Status: AC
  Filled 2017-11-22: qty 20

## 2017-11-22 MED ORDER — OXYTOCIN 10 UNIT/ML IJ SOLN
INTRAVENOUS | Status: DC | PRN
  Administered 2017-11-22: 40 [IU] via INTRAVENOUS

## 2017-11-22 MED ORDER — FENTANYL CITRATE (PF) 100 MCG/2ML IJ SOLN
INTRAMUSCULAR | Status: DC | PRN
  Administered 2017-11-22: 100 ug via INTRAVENOUS
  Administered 2017-11-22: 80 ug via INTRAVENOUS
  Administered 2017-11-22: 20 ug via INTRATHECAL

## 2017-11-22 MED ORDER — HYDROMORPHONE HCL 1 MG/ML IJ SOLN
0.2500 mg | INTRAMUSCULAR | Status: DC | PRN
  Administered 2017-11-22: 0.5 mg via INTRAVENOUS

## 2017-11-22 MED ORDER — LACTATED RINGERS IV SOLN
INTRAVENOUS | Status: DC | PRN
  Administered 2017-11-22 (×3): via INTRAVENOUS

## 2017-11-22 SURGICAL SUPPLY — 34 items
BENZOIN TINCTURE PRP APPL 2/3 (GAUZE/BANDAGES/DRESSINGS) ×4 IMPLANT
CHLORAPREP W/TINT 26ML (MISCELLANEOUS) ×4 IMPLANT
CLAMP CORD UMBIL (MISCELLANEOUS) IMPLANT
CLOSURE STERI STRIP 1/2 X4 (GAUZE/BANDAGES/DRESSINGS) ×4 IMPLANT
CLOSURE WOUND 1/2 X4 (GAUZE/BANDAGES/DRESSINGS)
CLOTH BEACON ORANGE TIMEOUT ST (SAFETY) ×4 IMPLANT
DRSG OPSITE POSTOP 4X10 (GAUZE/BANDAGES/DRESSINGS) ×4 IMPLANT
ELECT REM PT RETURN 9FT ADLT (ELECTROSURGICAL) ×4
ELECTRODE REM PT RTRN 9FT ADLT (ELECTROSURGICAL) ×2 IMPLANT
EXTRACTOR VACUUM BELL STYLE (SUCTIONS) IMPLANT
GLOVE BIOGEL M 6.5 STRL (GLOVE) ×4 IMPLANT
GLOVE BIOGEL PI IND STRL 6.5 (GLOVE) ×2 IMPLANT
GLOVE BIOGEL PI IND STRL 7.0 (GLOVE) ×4 IMPLANT
GLOVE BIOGEL PI INDICATOR 6.5 (GLOVE) ×2
GLOVE BIOGEL PI INDICATOR 7.0 (GLOVE) ×4
GOWN STRL REUS W/TWL LRG LVL3 (GOWN DISPOSABLE) ×12 IMPLANT
HEMOSTAT SURGICEL 2X14 (HEMOSTASIS) ×8 IMPLANT
KIT ABG SYR 3ML LUER SLIP (SYRINGE) IMPLANT
NEEDLE HYPO 22GX1.5 SAFETY (NEEDLE) ×4 IMPLANT
NEEDLE HYPO 25X1 1.5 SAFETY (NEEDLE) IMPLANT
NS IRRIG 1000ML POUR BTL (IV SOLUTION) ×4 IMPLANT
PACK C SECTION WH (CUSTOM PROCEDURE TRAY) ×4 IMPLANT
PAD OB MATERNITY 4.3X12.25 (PERSONAL CARE ITEMS) ×4 IMPLANT
PENCIL SMOKE EVAC W/HOLSTER (ELECTROSURGICAL) ×4 IMPLANT
STRIP CLOSURE SKIN 1/2X4 (GAUZE/BANDAGES/DRESSINGS) IMPLANT
SUT MON AB 4-0 PS1 27 (SUTURE) ×4 IMPLANT
SUT PLAIN 2 0 (SUTURE) ×2
SUT PLAIN ABS 2-0 CT1 27XMFL (SUTURE) ×2 IMPLANT
SUT VIC AB 0 CT1 36 (SUTURE) ×8 IMPLANT
SUT VIC AB 0 CTX 36 (SUTURE) ×2
SUT VIC AB 0 CTX36XBRD ANBCTRL (SUTURE) ×2 IMPLANT
SYR CONTROL 10ML LL (SYRINGE) ×4 IMPLANT
TOWEL OR 17X24 6PK STRL BLUE (TOWEL DISPOSABLE) ×4 IMPLANT
TRAY FOLEY BAG SILVER LF 14FR (SET/KITS/TRAYS/PACK) ×4 IMPLANT

## 2017-11-22 NOTE — MAU Note (Signed)
NICU, House, and Anesthesia notified.

## 2017-11-22 NOTE — MAU Note (Signed)
Urine is in the Lab 

## 2017-11-22 NOTE — MAU Provider Note (Signed)
History     CSN: [id number]  Arrival date and time: 11/22/17 1625   First Provider Initiated Contact with Patient 11/22/17 1657      Chief Complaint  Patient presents with  . Vaginal Bleeding  . Pelvic Pain   HPI [name] is a 27 y.o. [phone] at [redacted]w[redacted]d who presents to MAU today with complaint of vaginal swelling, discharge and spotting. She was seen in MAU on 11/19/17. Wet prep and GC/Chlamydia was negative. She has continued to have vaginal pain, swelling, itching and irritation. She also noted a few scant spots of blood on the tissue today with wiping. She has had a thick white discharge. She has intermittent abdominal pains that are associated with FM. She reports normal FM. She denies contractions or LOF.   OB History    Gravida Para Term Preterm AB Living   4 2 2   1 2    SAB TAB Ectopic Multiple Live Births   1 0   0 2      Past Medical History:  Diagnosis Date  . Anemia   . Depression   . Headache    hx of migraines  . Hypertension    with 2nd pregnancy  . Trichomonas contact     Past Surgical History:  Procedure Laterality Date  . CESAREAN SECTION N/A 05/26/2016   Procedure: CESAREAN SECTION;  Surgeon: Tilda Burrow, MD;  Location: [facility] BIRTHING SUITES;  Service: Obstetrics;  Laterality: N/A;    Family History  Problem Relation Age of Onset  . Diabetes Mother   . Hypertension Mother     Social History   Tobacco Use  . Smoking status: Former Smoker    Packs/day: 0.50    Types: Cigarettes    Last attempt to quit: 07/2017    Years since quitting: 0.3  . Smokeless tobacco: Never Used  Substance Use Topics  . Alcohol use: Yes    Comment: 3 months ago as of 08/03/17  . Drug use: Yes    Types: Marijuana    Comment: positive on 11/19/17    Allergies: No Known Allergies  Medications Prior to Admission  Medication Sig Dispense Refill Last Dose  . acetaminophen (TYLENOL) 500 MG tablet Take 500 mg by mouth every 6 (six) hours as needed for  moderate pain.   08/03/2017 at Unknown time  . cephALEXin (KEFLEX) 500 MG capsule Take 1 capsule (500 mg total) by mouth 4 (four) times daily. 40 capsule 0   . ferrous sulfate (FERROUSUL) 325 (65 FE) MG tablet Take 1 tablet (325 mg total) by mouth 2 (two) times daily with a meal. 60 tablet 3   . labetalol (NORMODYNE) 200 MG tablet Take 1 tablet (200 mg total) by mouth 2 (two) times daily. 60 tablet 5   . promethazine (PHENERGAN) 25 MG tablet Take 1 tablet (25 mg total) by mouth every 6 (six) hours as needed for nausea or vomiting. 30 tablet 0   . ranitidine (ZANTAC) 150 MG tablet Take 1 tablet (150 mg total) by mouth at bedtime. 30 tablet 1     Review of Systems  Constitutional: Negative for fever.  Gastrointestinal: Positive for abdominal pain. Negative for constipation, diarrhea, nausea and vomiting.  Genitourinary: Positive for vaginal bleeding and vaginal discharge. Negative for dysuria, frequency and urgency.   Physical Exam   Blood pressure 115/74, pulse (!) 102, temperature 97.6 F (36.4 C), temperature source Oral, resp. rate 18, weight 235 lb 1.3 oz (106.6 kg), last menstrual period  05/26/2017, SpO2 97 %, unknown if currently breastfeeding.  Physical Exam  Nursing note and vitals reviewed. Constitutional: She is oriented to person, place, and time. She appears well-developed and well-nourished. No distress.  HENT:  Head: Normocephalic and atraumatic.  Cardiovascular: Tachycardia present.  Respiratory: Effort normal.  GI: Soft. There is no tenderness.  Genitourinary:    There is tenderness on the right labia. There is no rash on the right labia. There is tenderness on the left labia. There is no rash on the left labia.  Neurological: She is alert and oriented to person, place, and time.  Skin: Skin is warm and dry. No erythema.  Psychiatric: She has a normal mood and affect.    Fetal Monitoring: Baseline A: 140 bpm Variability: moderate Accelerations: 10 x 10 and few 15  x 15 Decelerations: none Baseline B: 150 bpm Variability: moderate Accelerations: 10 x 10 and 15 x 15 Decelerations: none Contractions: none   MAU Course  Procedures None  MDM UA today  Reviewed note and labs from 1/26 Discussed with patient concern for yeast based on clinical symptoms. Patient had elevated Hgb A1c at last visit.   Assessment and Plan  A: Twin IUP at 9467w3d Yeast vulvovaginitis, clinical  No prenatal care  Impaired glucose tolerance   P: Discharge home Rx for Terazol given to patient  Preterm labor precautions discussed Patient advised to follow-up with [facility] on Thursday as scheduled to start prenatal care Patient may return to MAU as needed or if her condition were to change or worsen  Vonzella NippleJulie Messina Kosinski, PA-C 11/22/2017, 5:23 PM

## 2017-11-22 NOTE — Discharge Instructions (Signed)

## 2017-11-22 NOTE — MAU Note (Signed)
PT  SAYS SHE WAS HERE  TODAY  FOR SAME PAIN -  PAIN HAS GOTTEN WORSE .  PNC  WITH [facility].

## 2017-11-22 NOTE — MAU Provider Note (Signed)
Chief Complaint:  Abdominal Pain   First Provider Initiated Contact with Patient 11/22/17 2014     HPI: Laura Leonard is a 27 y.o. [id number] at 7329w3dwho presents to maternity admissions reporting strong abdominal pain that comes and goes.  States has been having it all day but is worse now.  Was seen earlier for pain, spotting and vaginiis.   Was not contracting then.. She reports good fetal movement, denies LOF, vaginal bleeding, vaginal itching/burning, urinary symptoms, h/a, dizziness, n/v, diarrhea, constipation or fever/chills.    History remarkable for no prenatal care, one c/s for fetal intolerance (2017, [facility]), and elevated HgbA1C of 6.1  Had gestational hypertension last pregnancy Is Rh Neg, hx MJ use last pregnancy, and little PNC last pregnancy.  Both of prior pregnancies were full term with no preterm labor  Abdominal Pain  This is a new problem. The current episode started today. The problem occurs intermittently. The problem has been gradually worsening. The pain is severe. The quality of the pain is cramping and sharp. Pertinent negatives include no constipation, diarrhea, dysuria, frequency, headaches or myalgias. Nothing aggravates the pain. The pain is relieved by nothing. She has tried nothing for the symptoms.     Past Medical History: Past Medical History:  Diagnosis Date  . Anemia   . Depression   . Headache    hx of migraines  . Hypertension    with 2nd pregnancy  . Trichomonas contact     Past obstetric history: OB History  Gravida Para Term Preterm AB Living  4 2 2   1 2   SAB TAB Ectopic Multiple Live Births  1 0   0 2    # Outcome Date GA Lbr Len/2nd Weight Sex Delivery Anes PTL Lv  4 Current           3 Term 05/26/16 2865w1d  8 lb 2 oz (3.685 kg) F CS-LTranv EPI  LIV  2 Term 05/01/08   7 lb 9 oz (3.43 kg) F Vag-Spont   LIV  1 SAB               Past Surgical History: Past Surgical History:  Procedure Laterality Date  . CESAREAN SECTION N/A  05/26/2016   Procedure: CESAREAN SECTION;  Surgeon: Tilda BurrowJohn V Ferguson, MD;  [facility];  Service: Obstetrics;  Laterality: N/A;    Family History: Family History  Problem Relation Age of Onset  . Diabetes Mother   . Hypertension Mother     Social History: Social History   Tobacco Use  . Smoking status: Former Smoker    Packs/day: 0.50    Types: Cigarettes    Last attempt to quit: 07/2017    Years since quitting: 0.3  . Smokeless tobacco: Never Used  Substance Use Topics  . Alcohol use: Yes    Comment: 3 months ago as of 08/03/17  . Drug use: Yes    Types: Marijuana    Comment: positive on 11/19/17    Allergies: No Known Allergies  Meds:  Medications Prior to Admission  Medication Sig Dispense Refill Last Dose  . cephALEXin (KEFLEX) 500 MG capsule Take 1 capsule (500 mg total) by mouth 4 (four) times daily. 40 capsule 0 11/22/2017 at Unknown time  . labetalol (NORMODYNE) 200 MG tablet Take 1 tablet (200 mg total) by mouth 2 (two) times daily. 60 tablet 5 11/22/2017 at Unknown time  . acetaminophen (TYLENOL) 500 MG tablet Take 500 mg by mouth every 6 (six) hours  as needed for moderate pain.   08/03/2017 at Unknown time  . ferrous sulfate (FERROUSUL) 325 (65 FE) MG tablet Take 1 tablet (325 mg total) by mouth 2 (two) times daily with a meal. 60 tablet 3   . promethazine (PHENERGAN) 25 MG tablet Take 1 tablet (25 mg total) by mouth every 6 (six) hours as needed for nausea or vomiting. 30 tablet 0   . ranitidine (ZANTAC) 150 MG tablet Take 1 tablet (150 mg total) by mouth at bedtime. 30 tablet 1   . terconazole (TERAZOL 7) 0.4 % vaginal cream Place 1 applicator vaginally at bedtime. 45 g 0     I have reviewed patient's Past Medical Hx, Surgical Hx, Family Hx, Social Hx, medications and allergies.   ROS:  Review of Systems  Gastrointestinal: Positive for abdominal pain. Negative for constipation and diarrhea.  Genitourinary: Negative for dysuria and frequency.   Musculoskeletal: Negative for myalgias.  Neurological: Negative for headaches.   Other systems negative  Physical Exam   Patient Vitals for the past 24 hrs:  BP Temp Temp src Pulse Resp Height Weight  11/22/17 1948 (!) 144/87 98.6 F (37 C) Oral (!) 106 20 5\' 6"  (1.676 m) 233 lb 12 oz (106 kg)   Constitutional: Well-developed, well-nourished female in no acute distress.  Cardiovascular: normal rate and rhythm Respiratory: normal effort, clear to auscultation bilaterally GI: Abd soft, non-tender, gravid appropriate for gestational age.   No rebound or guarding. MS: Extremities nontender, no edema, normal ROM Neurologic: Alert and oriented x 4.  GU: Neg CVAT.  PELVIC EXAM:   Exam by:: Wynelle Bourgeois CNM                             No palpable cervix, bulging membranes, feet in vagina  FHT:  Baseline 145-150 , moderate variability, accelerations present, no decelerations Contractions: q 3 mins    Labs: --/--/B NEG (01/26 1804) No results found for this or any previous visit (from the past 24 hour(s)).  GC/Chlamydia done 3 days ago and were negative Wet prep was negative OB panel pending  Imaging:  No results found.  MAU Course/MDM: I have ordered labs and reviewed results.  NST reviewed and found to be reassuring with preterm contractions Consult Dr Earlene Plater with presentation, exam findings and test results.  Treatments in MAU included IV fluids, MgSO4, Betamethasone, antibiotics ordered.  Prepared for Cesarean Section due to malpresentation..    Assessment: Twin IUP at [redacted]w[redacted]d Mono/Di twins per 10 wks [place] Preterm labor with advanced dilation of cervix and bulging membranes No prenatal care   Plan: To OR for delivery  Wynelle Bourgeois CNM, MSN Certified Nurse-Midwife 11/22/2017 8:14 PM

## 2017-11-22 NOTE — Op Note (Signed)
Laura Leonard PROCEDURE DATE: 11/22/2017  PREOPERATIVE DIAGNOSES: Intrauterine pregnancy at [redacted]w[redacted]d weeks gestation; mono-di twins with pre-term labor fully dilated, presenting twin breech presentation, prior low transverse uterine incision  POSTOPERATIVE DIAGNOSES: The same  PROCEDURE: Repeat Classical Cesarean Section  SURGEON:  Baldemar Lenis, MD  ASSISTANT:  Nolene Ebbs, MD  ANESTHESIOLOGY TEAM: Anesthesiologist: Leilani Able, MD CRNA: Renford Dills, CRNA  INDICATIONS: Laura Leonard is a 27 y.o. [phone] at [redacted]w[redacted]d here for cesarean section secondary to the indications listed under preoperative diagnoses; please see preoperative note for further details.  The risks of cesarean section were discussed with the patient including but were not limited to: bleeding which may require transfusion or reoperation; infection which may require antibiotics; injury to bowel, bladder, ureters or other surrounding organs; injury to the fetus; need for additional procedures including hysterectomy in the event of a life-threatening hemorrhage; placental abnormalities wth subsequent pregnancies, incisional problems, thromboembolic phenomenon and other postoperative/anesthesia complications.  She consents to blood transfusion in the event of an emergency. The patient verbalized understanding of the plan, giving informed written consent for the procedure.    FINDINGS:  Twin A: viable female in breech position, Apgars 3/7. Clear amniotic fluid. Twin B: viable female in transverse position, Apgars 5/8. Placenta detached from anterior uterine wall but with appearance of being intact placenta, three vessel cord.  Non-well developed lower uterine segment with significant tortuous vessels bilaterally in lower uterine segment, otherwise normal uterus, fallopian tubes and ovaries bilaterally.  ANESTHESIA: Spinal  INTRAVENOUS FLUIDS: 2400 ml   ESTIMATED BLOOD LOSS: 1300 ml URINE OUTPUT:  125  ml SPECIMENS: Placenta sent to pathology COMPLICATIONS: None immediate  PROCEDURE IN DETAIL:  The patient preoperatively received intravenous antibiotics and had sequential compression devices applied to her lower extremities.  She was then taken to the operating room where spinal anesthesia was administered and was found to be adequate. She was then placed in a dorsal supine position with a leftward tilt, and prepped and draped in a sterile manner.  A foley catheter was placed into her bladder with sterile technique and attached to constant gravity.  After a timeout was performed, a Pfannenstiel skin incision was made with scalpel over her preexisting scar and carried through to the underlying layer of fascia. The fascia was incised in the midline, and this incision was extended bilaterally using the Mayo scissors.  Kocher clamps were applied to the superior aspect of the fascial incision and the underlying rectus muscles were dissected off bluntly.  The rectus muscles were separated in the midline bluntly and the peritoneum was entered bluntly. The peritoneal incision was carefully extended bluntly laterally and caudad with good visualization of the bladder. The uterus appeared as above with significant bilateral tortuous vessels on either side of non-well developed lower uterine segment. Very small bladder flap was carefully dissected down off the lower uterine segment. Decision made to proceed with classical c-section. Attention was turned to the anterior uterus were a classical incision was made over the lower uterine segment and extended cephalad with the bandage scissors. The amniotic sac of Twin A was ruptured to clear fluid and the feet of Twin A were grasped and delivered easily. The body of Twin A was delivered and the arms, shoulders and head were easily delivered. Infant viable from delivery. Cord clamped and cut and infant handed to waiting pedi team. The sac of Twin B was then ruptured to clear  fluid. Twin B was noted to be transverse with  head to maternal right. I was unable to turn Twin B and Dr. Macon LargeAnyanwu was called for. Twin B was then turned and delivered easily from footling breech presentation. Cord clamped and cut and infant handed to waiting pedi staff. Placenta delivered intact and sent to pathology. The uterus was exteriorized and the incision was closed in two layers with 0-Vicryl. Good hemostasis noted at incision site. Small amount of bleeding noted at bladder flap that improved with pressure. Uterus gently replaced within abdomen and again inspected.  The fallopian tubes and ovaries were visualized bilaterally and normal appearing. Surgicel placed over hysterotomy and at bladder flap. The pelvis was cleared of all clot and debris. Hemostasis was confirmed on all surfaces. The fascia was then closed using 0 Vicryl in a running fashion.  The subcutaneous layer was irrigated, then reapproximated with 2-0 plain gut interrupted stitches.  The skin was closed with a 4-0 Monocryl subcuticular stitch. The patient tolerated the procedure well. Sponge, lap, instrument and needle counts were correct x 3.  She was taken to the recovery room in stable condition.    Baldemar LenisK. Meryl Nirav Sweda, M.D. Attending Obstetrician & Gynecologist, [facility], [facility]

## 2017-11-22 NOTE — Anesthesia Procedure Notes (Signed)
Spinal  Patient location during procedure: OR Start time: 11/22/2017 9:15 PM End time: 11/22/2017 9:18 PM Staffing Anesthesiologist: Leilani AbleHatchett, Armand Preast, MD Performed: anesthesiologist  Preanesthetic Checklist Completed: patient identified, site marked, surgical consent, pre-op evaluation, timeout performed, IV checked, risks and benefits discussed and monitors and equipment checked Spinal Block Patient position: sitting Prep: site prepped and draped and DuraPrep Patient monitoring: continuous pulse ox and blood pressure Location: L3-4 Injection technique: single-shot Needle Needle type: Pencan  Needle length: 10 cm Needle insertion depth: 6 cm Assessment Sensory level: T4

## 2017-11-22 NOTE — Anesthesia Preprocedure Evaluation (Signed)
Anesthesia Evaluation  Patient identified by MRN, date of birth, ID band Patient awake    Reviewed: Allergy & Precautions, H&P , NPO status , Patient's Chart, lab work & pertinent test results  Airway Mallampati: II  TM Distance: >3 FB Neck ROM: full    Dental no notable dental hx.    Pulmonary neg pulmonary ROS, former smoker,    Pulmonary exam normal breath sounds clear to auscultation       Cardiovascular hypertension, Pt. on home beta blockers negative cardio ROS Normal cardiovascular exam Rhythm:regular Rate:Normal     Neuro/Psych    GI/Hepatic negative GI ROS, Neg liver ROS,   Endo/Other  negative endocrine ROS  Renal/GU negative Renal ROS     Musculoskeletal   Abdominal (+) + obese,   Peds  Hematology  (+) Blood dyscrasia, anemia ,   Anesthesia Other Findings   Reproductive/Obstetrics (+) Pregnancy                             Anesthesia Physical Anesthesia Plan  ASA: II  Anesthesia Plan: Spinal   Post-op Pain Management:    Induction:   PONV Risk Score and Plan: 3 and Ondansetron, Dexamethasone, Scopolamine patch - Pre-op and Treatment may vary due to age or medical condition  Airway Management Planned: Natural Airway and Nasal Cannula  Additional Equipment:   Intra-op Plan:   Post-operative Plan:   Informed Consent: I have reviewed the patients History and Physical, chart, labs and discussed the procedure including the risks, benefits and alternatives for the proposed anesthesia with the patient or authorized representative who has indicated his/her understanding and acceptance.     Plan Discussed with: CRNA and Surgeon  Anesthesia Plan Comments:         Anesthesia Quick Evaluation

## 2017-11-22 NOTE — MAU Note (Addendum)
Patient c/o  +pelvic pain +"vaginal swelling" + increase in pressure Rating pain 9/10 Started yesterday Intermittent  +vaginal bleeding Notices when wipes Started today Denies recent intercourse  +vaginal discharge White and thin Denies odor

## 2017-11-22 NOTE — Transfer of Care (Signed)
Immediate Anesthesia Transfer of Care Note  Patient: Laura Leonard  Procedure(s) Performed: CESAREAN SECTION (N/A )  Patient Location: PACU  Anesthesia Type:Spinal  Level of Consciousness: awake  Airway & Oxygen Therapy: Patient Spontanous Breathing  Post-op Assessment: Report given to RN and Post -op Vital signs reviewed and stable  Post vital signs: stable  Last Vitals:  Vitals:   11/22/17 1948  BP: (!) 144/87  Pulse: (!) 106  Resp: 20  Temp: 37 C    Last Pain:  Vitals:   11/22/17 2032  TempSrc:   PainSc: 10-Worst pain ever         Complications: No apparent anesthesia complications

## 2017-11-22 NOTE — H&P (Signed)
Obstetric Preoperative History and Physical  Laura Leonard is a 27 y.o. [phone] with mono-di twin IUP at 638w3d presenting for PTL, found to be complete with twin A in breech presentation. Will proceed with cesarean section.  .   Prenatal Course Source of Care: No prenatal care  Prenatal labs and studies: ABO, Rh: --/--/B NEG (01/26 1804) Antibody: NEG (01/26 1804) Rubella: <0.90 (01/26 1804) RPR: Non Reactive (01/26 1804)  HBsAg: Negative (01/26 1804)  HIV: Non Reactive (01/26 1804)  GBS: unknown 1 hr Glucola  N/A Genetic screening N/A Anatomy US N/A  Prenatal Transfer Tool  Maternal Diabetes: N/A Genetic Screening: N/A Maternal Ultrasounds/Referrals: N/A Fetal Ultrasounds or other Referrals:  None Maternal Substance Abuse:  unknown Significant Maternal Medications:  None Significant Maternal Lab Results: None  Past Medical History:  Diagnosis Date  . Anemia   . Depression   . Headache    hx of migraines  . Hypertension    with 2nd pregnancy  . Trichomonas contact     Past Surgical History:  Procedure Laterality Date  . CESAREAN SECTION N/A 05/26/2016   Procedure: CESAREAN SECTION;  Surgeon: Tilda BurrowJohn V Ferguson, MD;  [facility];  Service: Obstetrics;  Laterality: N/A;    OB History  Gravida Para Term Preterm AB Living  4 2 2   1 2   SAB TAB Ectopic Multiple Live Births  1 0   0 2    # Outcome Date GA Lbr Len/2nd Weight Sex Delivery Anes PTL Lv  4 Current           3 Term 05/26/16 246w1d  8 lb 2 oz (3.685 kg) F CS-LTranv EPI  LIV  2 Term 05/01/08   7 lb 9 oz (3.43 kg) F Vag-Spont   LIV  1 SAB               Social History   Socioeconomic History  . Marital status: Single    Spouse name: None  . Number of children: None  . Years of education: None  . Highest education level: None  Social Needs  . Financial resource strain: None  . Food insecurity - worry: None  . Food insecurity - inability: None  . Transportation needs - medical:  None  . Transportation needs - non-medical: None  Occupational History  . None  Tobacco Use  . Smoking status: Former Smoker    Packs/day: 0.50    Types: Cigarettes    Last attempt to quit: 07/2017    Years since quitting: 0.3  . Smokeless tobacco: Never Used  Substance and Sexual Activity  . Alcohol use: Yes    Comment: 3 months ago as of 08/03/17  . Drug use: Yes    Types: Marijuana    Comment: positive on 11/19/17  . Sexual activity: Yes    Comment: last sex 2-3 wks ago  Other Topics Concern  . None  Social History Narrative  . None    Family History  Problem Relation Age of Onset  . Diabetes Mother   . Hypertension Mother     Medications Prior to Admission  Medication Sig Dispense Refill Last Dose  . cephALEXin (KEFLEX) 500 MG capsule Take 1 capsule (500 mg total) by mouth 4 (four) times daily. 40 capsule 0 11/22/2017 at Unknown time  . labetalol (NORMODYNE) 200 MG tablet Take 1 tablet (200 mg total) by mouth 2 (two) times daily. 60 tablet 5 11/22/2017 at Unknown time  . acetaminophen (TYLENOL) 500 MG  tablet Take 500 mg by mouth every 6 (six) hours as needed for moderate pain.   08/03/2017 at Unknown time  . ferrous sulfate (FERROUSUL) 325 (65 FE) MG tablet Take 1 tablet (325 mg total) by mouth 2 (two) times daily with a meal. 60 tablet 3   . promethazine (PHENERGAN) 25 MG tablet Take 1 tablet (25 mg total) by mouth every 6 (six) hours as needed for nausea or vomiting. 30 tablet 0   . ranitidine (ZANTAC) 150 MG tablet Take 1 tablet (150 mg total) by mouth at bedtime. 30 tablet 1   . terconazole (TERAZOL 7) 0.4 % vaginal cream Place 1 applicator vaginally at bedtime. 45 g 0     No Known Allergies  Review of Systems: Negative except for what is mentioned in HPI.  Physical Exam: BP (!) 144/87 (BP Location: Right Arm)   Pulse (!) 106   Temp 98.6 F (37 C) (Oral)   Resp 20   Ht 5\' 6"  (1.676 m)   Wt 233 lb 12 oz (106 kg)   LMP 05/26/2017 (Exact Date)   BMI 37.73  kg/m  FHR 150 mod variability; 155 mod varibility CONSTITUTIONAL: in pain with contractions HENT:  Normocephalic, atraumatic. EYES: Conjunctivae and EOM are normal. No scleral icterus.  NECK: Normal range of motion, supple SKIN: Skin is warm and dry. No rash noted. Not diaphoretic. No erythema. NEUROLGIC: Alert and oriented to person, place, and time. Normal reflexes, muscle tone coordination. No cranial nerve deficit noted. PSYCHIATRIC: Normal mood and affect. Normal behavior. CARDIOVASCULAR: Normal heart rate noted, RESPIRATORY: Effort and breath sounds normal, no problems with respiration noted ABDOMEN: Soft, nontender, nondistended, gravid.  MUSCULOSKELETAL: Normal range of motion. No edema and no tenderness. 2+ distal pulses.   Pertinent Labs/Studies:   No results found for this or any previous visit (from the past 72 hour(s)).  Assessment and Plan :Laura Leonard is a 27 y.o. [phone] at [redacted]w[redacted]d being admitted for cesarean section due to active PTL with twins in breech presentation. The risks of cesarean section discussed with the patient included but were not limited to: bleeding which may require transfusion or reoperation; infection which may require antibiotics; injury to bowel, bladder, ureters or other surrounding organs; injury to the fetus; need for additional procedures including hysterectomy in the event of a life-threatening hemorrhage; placental abnormalities wth subsequent pregnancies, incisional problems, thromboembolic phenomenon and other postoperative/anesthesia complications. The patient concurred with the proposed plan, giving informed written consent for the procedure. Patient has been NPO since last night she will remain NPO for procedure. Anesthesia and OR aware. Preoperative prophylactic antibiotics and SCDs ordered on call to the OR. To OR when ready.    Raynelle Fanning P. Ariv Penrod, MD OB Fellow Faculty Practice, [facility]

## 2017-11-23 ENCOUNTER — Encounter (HOSPITAL_COMMUNITY): Payer: Self-pay | Admitting: Neonatal-Perinatal Medicine

## 2017-11-23 ENCOUNTER — Other Ambulatory Visit: Payer: Self-pay

## 2017-11-23 LAB — CBC
HEMATOCRIT: 23.1 % — AB (ref 36.0–46.0)
Hemoglobin: 7.6 g/dL — ABNORMAL LOW (ref 12.0–15.0)
MCH: 24.5 pg — ABNORMAL LOW (ref 26.0–34.0)
MCHC: 32.9 g/dL (ref 30.0–36.0)
MCV: 74.5 fL — ABNORMAL LOW (ref 78.0–100.0)
Platelets: 205 10*3/uL (ref 150–400)
RBC: 3.1 MIL/uL — ABNORMAL LOW (ref 3.87–5.11)
RDW: 15.6 % — AB (ref 11.5–15.5)
WBC: 12.9 10*3/uL — ABNORMAL HIGH (ref 4.0–10.5)

## 2017-11-23 LAB — RPR: RPR Ser Ql: NONREACTIVE

## 2017-11-23 LAB — HEPATITIS B SURFACE ANTIGEN: HEP B S AG: NEGATIVE

## 2017-11-23 MED ORDER — DIBUCAINE 1 % RE OINT: 1.0000 "application " | TOPICAL_OINTMENT | RECTAL | Status: DC | PRN

## 2017-11-23 MED ORDER — SIMETHICONE 80 MG PO CHEW: 80.0000 mg | CHEWABLE_TABLET | ORAL | Status: DC | PRN

## 2017-11-23 MED ORDER — OXYCODONE HCL 5 MG PO TABS
10.0000 mg | ORAL_TABLET | ORAL | Status: DC | PRN
  Administered 2017-11-23 – 2017-11-25 (×11): 10 mg via ORAL
  Filled 2017-11-23 (×11): qty 2

## 2017-11-23 MED ORDER — SENNOSIDES-DOCUSATE SODIUM 8.6-50 MG PO TABS
2.0000 | ORAL_TABLET | ORAL | Status: DC
  Administered 2017-11-23 – 2017-11-25 (×3): 2 via ORAL
  Filled 2017-11-23 (×3): qty 2

## 2017-11-23 MED ORDER — SIMETHICONE 80 MG PO CHEW
80.0000 mg | CHEWABLE_TABLET | ORAL | Status: DC
  Administered 2017-11-23 – 2017-11-25 (×3): 80 mg via ORAL
  Filled 2017-11-23 (×3): qty 1

## 2017-11-23 MED ORDER — ENOXAPARIN SODIUM 40 MG/0.4ML ~~LOC~~ SOLN
40.0000 mg | SUBCUTANEOUS | Status: DC
  Administered 2017-11-23 – 2017-11-24 (×2): 40 mg via SUBCUTANEOUS
  Filled 2017-11-23 (×3): qty 0.4

## 2017-11-23 MED ORDER — RHO D IMMUNE GLOBULIN 1500 UNIT/2ML IJ SOSY
300.0000 ug | PREFILLED_SYRINGE | Freq: Once | INTRAMUSCULAR | Status: AC
  Administered 2017-11-23: 300 ug via INTRAVENOUS
  Filled 2017-11-23: qty 2

## 2017-11-23 MED ORDER — OXYCODONE HCL 5 MG PO TABS
5.0000 mg | ORAL_TABLET | ORAL | Status: DC | PRN
  Administered 2017-11-23: 5 mg via ORAL
  Filled 2017-11-23: qty 1

## 2017-11-23 MED ORDER — WITCH HAZEL-GLYCERIN EX PADS: 1.0000 "application " | MEDICATED_PAD | CUTANEOUS | Status: DC | PRN

## 2017-11-23 MED ORDER — LACTATED RINGERS IV SOLN: INTRAVENOUS | Status: DC

## 2017-11-23 MED ORDER — IBUPROFEN 600 MG PO TABS
600.0000 mg | ORAL_TABLET | Freq: Four times a day (QID) | ORAL | Status: DC
  Administered 2017-11-23 – 2017-11-25 (×9): 600 mg via ORAL
  Filled 2017-11-23 (×9): qty 1

## 2017-11-23 MED ORDER — MENTHOL 3 MG MT LOZG: 1.0000 | LOZENGE | OROMUCOSAL | Status: DC | PRN

## 2017-11-23 MED ORDER — ACETAMINOPHEN 325 MG PO TABS
650.0000 mg | ORAL_TABLET | ORAL | Status: DC | PRN
  Administered 2017-11-23 – 2017-11-24 (×2): 650 mg via ORAL
  Filled 2017-11-23 (×2): qty 2

## 2017-11-23 MED ORDER — OXYTOCIN 40 UNITS IN LACTATED RINGERS INFUSION - SIMPLE MED: 2.5000 [IU]/h | INTRAVENOUS | Status: DC

## 2017-11-23 MED ORDER — DIPHENHYDRAMINE HCL 25 MG PO CAPS: 25.0000 mg | ORAL_CAPSULE | Freq: Four times a day (QID) | ORAL | Status: DC | PRN

## 2017-11-23 MED ORDER — PRENATAL MULTIVITAMIN CH
1.0000 | ORAL_TABLET | Freq: Every day | ORAL | Status: DC
  Administered 2017-11-23 – 2017-11-25 (×3): 1 via ORAL
  Filled 2017-11-23 (×3): qty 1

## 2017-11-23 MED ORDER — COCONUT OIL OIL: 1.0000 "application " | TOPICAL_OIL | Status: DC | PRN

## 2017-11-23 MED ORDER — SIMETHICONE 80 MG PO CHEW
80.0000 mg | CHEWABLE_TABLET | Freq: Three times a day (TID) | ORAL | Status: DC
  Administered 2017-11-24 – 2017-11-25 (×4): 80 mg via ORAL
  Filled 2017-11-23 (×5): qty 1

## 2017-11-23 MED ORDER — ZOLPIDEM TARTRATE 5 MG PO TABS: 5.0000 mg | ORAL_TABLET | Freq: Every evening | ORAL | Status: DC | PRN

## 2017-11-23 MED ORDER — TETANUS-DIPHTH-ACELL PERTUSSIS 5-2.5-18.5 LF-MCG/0.5 IM SUSP
0.5000 mL | Freq: Once | INTRAMUSCULAR | Status: AC
  Administered 2017-11-24: 0.5 mL via INTRAMUSCULAR
  Filled 2017-11-23: qty 0.5

## 2017-11-23 NOTE — Anesthesia Postprocedure Evaluation (Signed)
Anesthesia Post Note  Patient: Laura Leonard  Procedure(s) Performed: CESAREAN SECTION (N/A )     Patient location during evaluation: PACU Anesthesia Type: Spinal Level of consciousness: awake and sedated Pain management: pain level controlled Vital Signs Assessment: post-procedure vital signs reviewed and stable Respiratory status: spontaneous breathing Cardiovascular status: stable Postop Assessment: no headache, no backache, spinal receding, no apparent nausea or vomiting and patient able to bend at knees Anesthetic complications: no    Last Vitals:  Vitals:   11/22/17 2330 11/22/17 2345  BP: 105/61 (!) 106/57  Pulse: 96 99  Resp: 20 20  Temp: 37.2 C   SpO2: 93% 94%    Last Pain:  Vitals:   11/22/17 2330  TempSrc: Oral  PainSc: Asleep   Pain Goal:                 Kaja Jackowski JR,JOHN Manjot Beumer

## 2017-11-23 NOTE — Progress Notes (Signed)
Faculty Attending Note  Post Op Day 1  Subjective: Patient is feeling well. She reports moderately well controlled pain on PO pain meds. She is not ambulating and denies light-headedness or dizziness. She is not passing flatus. She is tolerating liquids now without nausea/vomiting. Bleeding is moderate. She is breast & bottle feeding. Babies are in NICU and doing well.  Objective: Blood pressure 117/71, pulse 89, temperature 99.7 F (37.6 C), temperature source Oral, resp. rate 20, height _0  (1.676 m), weight 233 lb 12 oz (106 kg), last menstrual period 05/26/2017, SpO2 96 %, unknown if currently breastfeeding. Temp:  [97.6 F (36.4 C)-99.7 F (37.6 C)] 99.7 F (37.6 C) (01/30 0540) Pulse Rate:  [89-106] 89 (01/30 0540) Resp:  [18-22] 20 (01/30 0540) BP: (94-144)/(43-87) 117/71 (01/30 0540) SpO2:  [91 %-97 %] 96 % (01/30 0540) Weight:  [233 lb 12 oz (106 kg)-235 lb 1.3 oz (106.6 kg)] 233 lb 12 oz (106 kg) (01/29 1948)  Physical Exam:  General: alert, oriented, cooperative Chest: normal respiratory effort Heart: RRR  Abdomen: soft, appropriately tender to palpation, incision covered by dressing with no evidence of active bleeding  Uterine Fundus: firm, at the umbilicus Lochia: moderate, rubra DVT Evaluation: no evidence of DVT Extremities: no edema, no calf tenderness  UOP: 100 mL/hr clear yellow urine   Current Facility-Administered Medications:  .  acetaminophen (TYLENOL) tablet 650 mg, 650 mg, Oral, Q4H PRN, Degele, Jenne Pane, MD .  coconut oil, 1 application, Topical, PRN, Degele, Jenne Pane, MD .  witch hazel-glycerin (TUCKS) pad 1 application, 1 application, Topical, PRN **AND** dibucaine (NUPERCAINAL) 1 % rectal ointment 1 application, 1 application, Rectal, PRN, Degele, Jenne Pane, MD .  diphenhydrAMINE (BENADRYL) capsule 25 mg, 25 mg, Oral, Q6H PRN, Degele, Jenne Pane, MD .  enoxaparin (LOVENOX) injection 40 mg, 40 mg, Subcutaneous, Q24H, Degele, Jenne Pane, MD .  ibuprofen  (ADVIL,MOTRIN) tablet 600 mg, 600 mg, Oral, Q6H, Degele, Jenne Pane, MD, 600 mg at 11/23/17 9509 .  lactated ringers infusion, , Intravenous, Continuous, Degele, Jenne Pane, MD .  menthol-cetylpyridinium (CEPACOL) lozenge 3 mg, 1 lozenge, Oral, Q2H PRN, Degele, Jenne Pane, MD .  oxyCODONE (Oxy IR/ROXICODONE) immediate release tablet 10 mg, 10 mg, Oral, Q4H PRN, Degele, Jenne Pane, MD, 10 mg at 11/23/17 0231 .  oxyCODONE (Oxy IR/ROXICODONE) immediate release tablet 5 mg, 5 mg, Oral, Q4H PRN, Degele, Jenne Pane, MD .  oxytocin (PITOCIN) IV infusion 40 units in LR 1000 mL - Premix, 2.5 Units/hr, Intravenous, Continuous, Degele, Jenne Pane, MD .  prenatal multivitamin tablet 1 tablet, 1 tablet, Oral, Q1200, Degele, Jenne Pane, MD .  senna-docusate (Senokot-S) tablet 2 tablet, 2 tablet, Oral, Q24H, Degele, Jenne Pane, MD, 2 tablet at 11/23/17 0231 .  simethicone (MYLICON) chewable tablet 80 mg, 80 mg, Oral, TID PC, Degele, Jenne Pane, MD .  simethicone [facility]) chewable tablet 80 mg, 80 mg, Oral, Q24H, Degele, Jenne Pane, MD, 80 mg at 11/23/17 0231 .  simethicone (MYLICON) chewable tablet 80 mg, 80 mg, Oral, PRN, Degele, Jenne Pane, MD .  Tdap (BOOSTRIX) injection 0.5 mL, 0.5 mL, Intramuscular, Once, Degele, Jenne Pane, MD .  zolpidem (AMBIEN) tablet 5 mg, 5 mg, Oral, QHS PRN, Degele, Jenne Pane, MD Recent Labs    11/22/17 2022  HGB 7.3*  HCT 23.2*    Assessment/Plan:  Patient is 27 y.o. [id number] POD#1 s/p repeat classical c-section of mono-di twins at 56w3dfor pre-term labor and malpresentation. She is doing well, recovering appropriately and complains only  of some pain. She is eager to get up to NICU and see babies. EBL intraop ~1200 mL with low starting H/H, s/p transfusion 2 units pRBCS. Repeat H/H this am. Discussed options for birth control, she is undecided.   Continue routine post partum care Pain meds prn Regular diet undecided for birth control Repeat CBC this am SW consult for pre-term delivery and limited prenatal  care, THC in UDS MMR ordered  Sloan Leiter 11/23/2017, 6:23 AM

## 2017-11-24 ENCOUNTER — Encounter: Payer: Medicaid Other | Admitting: Obstetrics and Gynecology

## 2017-11-24 LAB — RUBELLA SCREEN

## 2017-11-24 LAB — RH IG WORKUP (INCLUDES ABO/RH)
ABO/RH(D): B NEG
Fetal Screen: NEGATIVE
GESTATIONAL AGE(WKS): 29.3
Unit division: 0

## 2017-11-24 LAB — BPAM RBC
BLOOD PRODUCT EXPIRATION DATE: 201902142359
BLOOD PRODUCT EXPIRATION DATE: 201902242359
Blood Product Expiration Date: 201902092359
Blood Product Expiration Date: 201902232359
ISSUE DATE / TIME: 201901292309
ISSUE DATE / TIME: 201901300001
ISSUE DATE / TIME: 201901300334
UNIT TYPE AND RH: 9500
UNIT TYPE AND RH: 9500
UNIT TYPE AND RH: 9500
UNIT TYPE AND RH: 9500

## 2017-11-24 LAB — TYPE AND SCREEN
ABO/RH(D): B NEG
Antibody Screen: POSITIVE
UNIT DIVISION: 0
UNIT DIVISION: 0
Unit division: 0
Unit division: 0

## 2017-11-24 MED ORDER — SODIUM CHLORIDE 0.9% FLUSH
3.0000 mL | Freq: Two times a day (BID) | INTRAVENOUS | Status: DC
  Administered 2017-11-24: 3 mL via INTRAVENOUS

## 2017-11-24 MED ORDER — SODIUM CHLORIDE 0.9% FLUSH: 3.0000 mL | INTRAVENOUS | Status: DC | PRN

## 2017-11-24 NOTE — Progress Notes (Signed)
CSW attempted to meet with MOB to complete assessment [facility], hx of Marijuana use, hx of Depression and NICU admission of twins at 29.3 weeks, but MOB states she is in too much pain to talk at this time and asked CSW to return tomorrow.  CSW provided contact information and asked MOB to call CSW if she discharges prior to a CSW meeting with her so that a CSW can follow up with her when she is here visiting her twins.  MOB agreed.  CSW notes that babies' UDS' are negative.

## 2017-11-24 NOTE — Progress Notes (Signed)
Subjective: Postpartum Day 2: Cesarean Delivery Patient reports feeling well without some incisional pain. She is ambulated and voiding well. She is passing gas. She denies feeling dizzy or lightheaded. She denies chest pain or shortness of breath.    Objective: Vital signs in last 24 hours: Temp:  [98.2 F (36.8 C)-99.1 F (37.3 C)] 99.1 F (37.3 C) (01/31 0800) Pulse Rate:  [92-102] 92 (01/31 0800) Resp:  [19-20] 20 (01/31 0800) BP: (119-147)/(65-89) 124/65 (01/31 0800) SpO2:  [98 %-100 %] 98 % (01/31 0800)  Physical Exam:  General: alert, cooperative and no distress Lochia: appropriate Uterine Fundus: firm Incision: honeycomb dressing clean dry and intact DVT Evaluation: No evidence of DVT seen on physical exam. Negative Homan's sign.  Recent Labs    11/22/17 2022 11/23/17 0954  HGB 7.3* 7.6*  HCT 23.2* 23.1*    Assessment/Plan: Status post Cesarean section. Doing well postoperatively.  Continue current care CBC stable and patient is asymptomatic Advised patient to request pain medication as needed Discussed plan for discharge tomorrow Patient remains undecided on contraception.  Laura Leonard 11/24/2017, 10:44 AM

## 2017-11-25 ENCOUNTER — Encounter (HOSPITAL_COMMUNITY): Payer: Self-pay | Admitting: Obstetrics and Gynecology

## 2017-11-25 MED ORDER — OXYCODONE HCL 5 MG PO TABS: 5.0000 mg | ORAL_TABLET | ORAL | 0 refills | Status: DC | PRN

## 2017-11-25 MED ORDER — MEASLES, MUMPS & RUBELLA VAC ~~LOC~~ INJ
0.5000 mL | INJECTION | Freq: Once | SUBCUTANEOUS | Status: AC
  Administered 2017-11-25: 0.5 mL via SUBCUTANEOUS
  Filled 2017-11-25: qty 0.5

## 2017-11-25 MED ORDER — IBUPROFEN 600 MG PO TABS: 600.0000 mg | ORAL_TABLET | Freq: Four times a day (QID) | ORAL | 0 refills | Status: DC

## 2017-11-25 MED ORDER — AMLODIPINE BESYLATE 5 MG PO TABS
5.0000 mg | ORAL_TABLET | Freq: Every day | ORAL | Status: DC
  Administered 2017-11-25: 5 mg via ORAL
  Filled 2017-11-25: qty 1

## 2017-11-25 MED ORDER — AMLODIPINE BESYLATE 5 MG PO TABS: 5.0000 mg | ORAL_TABLET | Freq: Every day | ORAL | 0 refills | Status: DC

## 2017-11-25 NOTE — Discharge Summary (Signed)
OB Discharge Summary     Patient Name: Laura Leonard DOB: 10/13/1991 MRN: [id number]  Date of admission: 11/22/2017 Delivering MD:    Nyeemah, Jennette [[id number]]  DAVIS, Kyiesha, [place] [[id number]]  Leroy Libman M   Date of discharge: 11/25/2017  Admitting diagnosis: 28wks Sharp pains not kicks hurts Intrauterine pregnancy: [redacted]w[redacted]d     Secondary diagnosis:  Principal Problem:   Status post cesarean section Active Problems:   Rh negative, antepartum   Rubella non-immune status, antepartum   Marijuana abuse   History of classical cesarean section  Hospital course:  Sceduled C/S   27 y.o. yo [id number] at [redacted]w[redacted]d was admitted to the hospital 11/22/2017 for scheduled cesarean section with the following indication:Malpresentation and Prior Uterine Surgery.  Patient presented in active labor with fetal malpresentation breech/transverse. Patient did not have prenatal care for this pregnancy. Patient with initial Hg 7.9 upon admission. She underwent a repeat classical c-section. Given post surgical state, she was transfused 2 units immediately in the PACU in order to prevent symptomatic anemia. Her post transfusion Hg 7.6. Patient remained asymptomatic during her hospitalization. She ambulated, passed flatus, voided and tolerated a regular diet. She was found stable for discharge on POD#3. Discharge instructions were reviewed with the patient. Patient also with a history of hypertension in pregnancy and was started on labetalol during an MAU visit. Patient with some elevated BP during her postpartum period without any symptoms.  Patient discharged on Norvasc  Membrane Rupture Time/Date:    Bowie, [place]iron [[id number]]  9:45 PM   Nayzeth, [place] [[id number]]  9:46 PM ,   Alejah, Aristizabal [[id number]]  11/22/2017   Dioselina, [place] [[id number]]  11/22/2017   Patient delivered a Viable infants.   Dorita, [facility]  [[id number]]  11/22/2017   Laurice, Iglesia [[id number]]  11/22/2017  Details of operation can be found in separate operative note.  Pateint had an uncomplicated postpartum course.  She is ambulating, tolerating a regular diet, passing flatus, and urinating well. Patient is discharged home in stable condition on  11/25/17         Physical exam  Vitals:   11/24/17 1611 11/24/17 2200 11/25/17 0459 11/25/17 0800  BP: (!) 150/88 (!) 149/87 134/81 134/86  Pulse: 97 93 81 81  Resp: 20 18 18 18   Temp: 98.6 F (37 C) 98.1 F (36.7 C) 98.6 F (37 C) 98.4 F (36.9 C)  TempSrc: Oral Oral Oral Oral  SpO2: 99% 100% 100% 99%  Weight:      Height:       General: alert, cooperative and no distress Lochia: appropriate Uterine Fundus: firm Incision: honey comb dressing clean dry and intact DVT Evaluation: No evidence of DVT seen on physical exam. Negative Homan's sign. Labs: Lab Results  Component Value Date   WBC 12.9 (H) 11/23/2017   HGB 7.6 (L) 11/23/2017   HCT 23.1 (L) 11/23/2017   MCV 74.5 (L) 11/23/2017   PLT 205 11/23/2017   CMP Latest Ref Rng & Units 11/19/2017  Glucose 65 - 99 mg/dL 91  BUN 6 - 20 mg/dL 6  Creatinine 0.63 - 0.16 mg/dL 0.10(X)  Sodium 323 - 557 mmol/L 135  Potassium 3.5 - 5.1 mmol/L 3.6  Chloride 101 - 111 mmol/L 107  CO2 22 - 32 mmol/L 19(L)  Calcium 8.9 - 10.3 mg/dL 8.3(L)  Total Protein 6.5 - 8.1 g/dL 6.2(L)  Total Bilirubin 0.3 - 1.2 mg/dL 0.5  Alkaline Phos  38 - 126 U/L 84  AST 15 - 41 U/L 15  ALT 14 - 54 U/L 9(L)    Discharge instruction: per After Visit Summary and "Baby and Me Booklet".  After visit meds:  Allergies as of 11/25/2017   No Known Allergies     Medication List    STOP taking these medications   cephALEXin 500 MG capsule Commonly known as:  KEFLEX   labetalol 200 MG tablet Commonly known as:  NORMODYNE   terconazole 0.4 % vaginal cream Commonly known as:  TERAZOL 7     TAKE these medications   acetaminophen 500  MG tablet Commonly known as:  TYLENOL Take 500 mg by mouth every 6 (six) hours as needed for moderate pain.   amLODipine 5 MG tablet Commonly known as:  NORVASC Take 1 tablet (5 mg total) by mouth daily.   ibuprofen 600 MG tablet Commonly known as:  ADVIL,MOTRIN Take 1 tablet (600 mg total) by mouth every 6 (six) hours.   oxyCODONE 5 MG immediate release tablet Commonly known as:  Oxy IR/ROXICODONE Take 1 tablet (5 mg total) by mouth every 4 (four) hours as needed (pain scale 4-7).       Diet: routine diet  Activity: Advance as tolerated. Pelvic rest for 6 weeks.   Outpatient follow up:2 weeks Follow up Appt: Future Appointments  Date Time Provider Department Center  12/06/2017  1:30 PM [facility]  12/06/2017  2:45 PM [facility] NURSE [facility]  12/28/2017 10:55 AM Conan Bowensavis, Kelly M, MD [facility]   Follow up Visit:No Follow-up on file.  Postpartum contraception: Undecided  Newborn Data:   Lilia ProStephens, GirlA Ceili [[id number]]  Live born female  Birth Weight: 3 lb 6.3 oz (1540 g) APGAR: 3, 7  Newborn Delivery   Birth date/time:  11/22/2017 21:45:00 Delivery type:  C-Section, Low Vertical C-section categorization:  Repeat      Gean BirchwoodStephens, GirlB Giulia [[id number]][[id number]]  Live born female  Birth Weight: 3 lb 2.4 oz (1430 g) APGAR: 5, 8  Newborn Delivery   Birth date/time:  11/22/2017 21:51:00 Delivery type:  C-Section, Low Vertical C-section categorization:  Repeat     Baby Feeding: Bottle and Breast Disposition:NICU   11/25/2017 Catalina AntiguaPeggy Mirelle Biskup, MD

## 2017-11-25 NOTE — Progress Notes (Signed)
Discharge instructions given, questions answered, pt states understanding, signed and given copy 

## 2017-11-25 NOTE — Discharge Instructions (Signed)

## 2017-12-06 ENCOUNTER — Ambulatory Visit: Payer: Medicaid Other

## 2017-12-06 ENCOUNTER — Institutional Professional Consult (permissible substitution): Payer: Medicaid Other

## 2017-12-06 NOTE — BH Specialist Note (Deleted)
Integrated Behavioral Health Initial Visit  MRN: [id number] Name: Laura Leonard  Number of Integrated Behavioral Health Clinician visits:: 1/6 Session Start time: ***  Session End time: *** Total time: {IBH Total Time:21014050}  Type of Service: Integrated Behavioral Health- Individual/Family Interpretor:No. Interpretor Name and Language: n/a   Warm Hand Off Completed.       SUBJECTIVE: Laura Leonard is a 27 y.o. female accompanied by {CHL AMB ACCOMPANIED VF:[id number]}BY:[phone]} Patient was referred by Dr Earlene Plateravis for no prenatal care, hx of marijuana use and hx of depression Patient reports the following symptoms/concerns: *** Duration of problem: ***; Severity of problem: {Mild/Moderate/Severe:20260}  OBJECTIVE: Mood: {BHH MOOD:22306} and Affect: {BHH AFFECT:22307} Risk of harm to self or others: {CHL AMB BH Suicide Current Mental Status:21022748}  LIFE CONTEXT: Family and Social: *** School/Work: *** Self-Care: *** Life Changes: Recent childbirth of twins, currently in NICU ***  GOALS ADDRESSED: Patient will: 1. Reduce symptoms of: {IBH Symptoms:21014056} 2. Increase knowledge and/or ability of: {IBH Patient Tools:21014057}  3. Demonstrate ability to: {IBH Goals:21014053}  INTERVENTIONS: Interventions utilized: {IBH Interventions:21014054}  Standardized Assessments completed: {IBH Screening Tools:21014051}  ASSESSMENT: Patient currently experiencing ***.   Patient may benefit from psychoeducation and brief therapeutic interventions regarding coping with symptoms of *** .  PLAN: 1. Follow up with behavioral health clinician on : *** 2. Behavioral recommendations:  -*** -*** -*** 3. Referral(s): {IBH Referrals:21014055} 4. "From scale of 1-10, how likely are you to follow plan?": ***  Rae LipsJamie C Vaughn Beaumier, LCSW  Edinburgh Postnatal Depression Scale - 11/23/17 1400      Edinburgh Postnatal Depression Scale:  In the Past 7 Days   I have been able to laugh and  see the funny side of things.  0    I have looked forward with enjoyment to things.  0    I have blamed myself unnecessarily when things went wrong.  1    I have been anxious or worried for no good reason.  0    I have felt scared or panicky for no good reason.  0    Things have been getting on top of me.  1    I have been so unhappy that I have had difficulty sleeping.  0    I have felt sad or miserable.  0    I have been so unhappy that I have been crying.  0    The thought of harming myself has occurred to me.  0    Edinburgh Postnatal Depression Scale Total  2

## 2017-12-28 ENCOUNTER — Ambulatory Visit: Payer: Medicaid Other | Admitting: Obstetrics and Gynecology

## 2018-01-05 ENCOUNTER — Encounter (HOSPITAL_COMMUNITY): Payer: Self-pay | Admitting: Obstetrics and Gynecology

## 2018-12-19 ENCOUNTER — Other Ambulatory Visit: Payer: Self-pay

## 2018-12-19 ENCOUNTER — Emergency Department (HOSPITAL_COMMUNITY)
Admission: EM | Admit: 2018-12-19 | Discharge: 2018-12-19 | Disposition: A | Discharge status: Home / Self Care | Payer: Self-pay | Attending: Emergency Medicine | Admitting: Emergency Medicine

## 2018-12-19 ENCOUNTER — Encounter (HOSPITAL_COMMUNITY): Payer: Self-pay | Admitting: Emergency Medicine

## 2018-12-19 DIAGNOSIS — Z87891 Personal history of nicotine dependence: Secondary | ICD-10-CM | POA: Insufficient documentation

## 2018-12-19 DIAGNOSIS — N3000 Acute cystitis without hematuria: Secondary | ICD-10-CM

## 2018-12-19 DIAGNOSIS — N6001 Solitary cyst of right breast: Secondary | ICD-10-CM

## 2018-12-19 DIAGNOSIS — Z79899 Other long term (current) drug therapy: Secondary | ICD-10-CM | POA: Insufficient documentation

## 2018-12-19 LAB — URINALYSIS, ROUTINE W REFLEX MICROSCOPIC
Bilirubin Urine: NEGATIVE
GLUCOSE, UA: NEGATIVE mg/dL
Hgb urine dipstick: NEGATIVE
KETONES UR: NEGATIVE mg/dL
Nitrite: NEGATIVE
PROTEIN: 30 mg/dL — AB
Specific Gravity, Urine: 1.034 — ABNORMAL HIGH (ref 1.005–1.030)
pH: 6 (ref 5.0–8.0)

## 2018-12-19 LAB — CBC
HEMATOCRIT: 34.3 % — AB (ref 36.0–46.0)
Hemoglobin: 10.7 g/dL — ABNORMAL LOW (ref 12.0–15.0)
MCH: 24.7 pg — AB (ref 26.0–34.0)
MCHC: 31.2 g/dL (ref 30.0–36.0)
MCV: 79.2 fL — AB (ref 80.0–100.0)
Platelets: 252 10*3/uL (ref 150–400)
RBC: 4.33 MIL/uL (ref 3.87–5.11)
RDW: 15.8 % — ABNORMAL HIGH (ref 11.5–15.5)
WBC: 7 10*3/uL (ref 4.0–10.5)
nRBC: 0 % (ref 0.0–0.2)

## 2018-12-19 LAB — BASIC METABOLIC PANEL
Anion gap: 11 (ref 5–15)
BUN: 5 mg/dL — ABNORMAL LOW (ref 6–20)
CHLORIDE: 102 mmol/L (ref 98–111)
CO2: 21 mmol/L — AB (ref 22–32)
CREATININE: 0.52 mg/dL (ref 0.44–1.00)
Calcium: 8.4 mg/dL — ABNORMAL LOW (ref 8.9–10.3)
GFR calc Af Amer: >60 mL/min (ref >60)
GFR calc non Af Amer: >60 mL/min (ref >60)
GLUCOSE: 92 mg/dL (ref 70–99)
POTASSIUM: 3.5 mmol/L (ref 3.5–5.1)
SODIUM: 134 mmol/L — AB (ref 135–145)

## 2018-12-19 MED ORDER — LIDOCAINE HCL (PF) 1 % IJ SOLN
5.0000 mL | Freq: Once | INTRAMUSCULAR | Status: AC
  Administered 2018-12-19: 5 mL via INTRADERMAL
  Filled 2018-12-19: qty 5

## 2018-12-19 MED ORDER — FOSFOMYCIN TROMETHAMINE 3 G PO PACK
3.0000 g | PACK | Freq: Once | ORAL | Status: AC
  Administered 2018-12-19: 3 g via ORAL
  Filled 2018-12-19: qty 3

## 2018-12-19 NOTE — ED Provider Notes (Signed)
[facility] EMERGENCY DEPARTMENT Provider Note   CSN: [id number] Arrival date & time: 12/19/18  1543    History   Chief Complaint Chief Complaint  Patient presents with  . Back Pain    HPI Laura Leonard is a 28 y.o. female who presents the emergency department with chief complaint of breast abscess and back pain.  Patient states that she has had back pain which is worse with twisting movement bending.  She has difficulty falling asleep because her back is sore and she is currently having to shift positions in her mattress.  She denies any weakness of the lower extremities, loss of bowel or bladder continence, saddle anesthesia, IV drug use or recent procedures to her back.  She wears a 44 double D breast size and has had a history of back pain.  She has not tried any over-the-counter medications to reduce her symptoms.  Patient also notes a bump that came up on her right areole a.  It is tender and she thinks she has a small abscess would like it looked at.  She denies feelings of presyncope but states that she has had generalized malaise and fatigue over the past couple days.  She denies fevers, chills, urinary symptoms.     HPI  Past Medical History:  Diagnosis Date  . Anemia   . Depression   . Headache    hx of migraines  . Hypertension    with 2nd pregnancy  . Trichomonas contact     Patient Active Problem List   Diagnosis Date Noted  . Marijuana abuse 11/22/2017  . History of classical cesarean section 11/22/2017  . Status post cesarean section 11/22/2017  . Low grade squamous intraepithelial lesion (LGSIL) on cervical Pap smear, cannot rule out high grade cells on 04/06/16 04/08/2016  . Rh negative, antepartum 04/08/2016  . Rubella non-immune status, antepartum 04/08/2016    Past Surgical History:  Procedure Laterality Date  . CESAREAN SECTION N/A 05/26/2016   Procedure: CESAREAN SECTION;  Surgeon: Tilda Burrow, MD;  Location: [facility] BIRTHING  SUITES;  Service: Obstetrics;  Laterality: N/A;  . CESAREAN SECTION MULTI-GESTATIONAL  11/22/2017   Procedure: CESAREAN SECTION MULTI-GESTATIONAL;  Surgeon: Conan Bowens, MD;  Location: [facility] BIRTHING SUITES;  Service: Obstetrics;;     OB History    Gravida  4   Para  3   Term  2   Preterm  1   AB  1   Living  4     SAB  1   TAB  0   Ectopic      Multiple  1   Live Births  4            Home Medications    Prior to Admission medications   Medication Sig Start Date End Date Taking? Authorizing Provider  acetaminophen (TYLENOL) 500 MG tablet Take 500 mg by mouth every 6 (six) hours as needed for moderate pain.    [provider]  amLODipine (NORVASC) 5 MG tablet Take 1 tablet (5 mg total) by mouth daily. 11/25/17   Constant, Peggy, MD  ibuprofen (ADVIL,MOTRIN) 600 MG tablet Take 1 tablet (600 mg total) by mouth every 6 (six) hours. 11/25/17   Constant, Peggy, MD  oxyCODONE (OXY IR/ROXICODONE) 5 MG immediate release tablet Take 1 tablet (5 mg total) by mouth every 4 (four) hours as needed (pain scale 4-7). 11/25/17   Constant, Gigi Gin, MD    Family History Family History  Problem Relation  Age of Onset  . Diabetes Mother   . Hypertension Mother     Social History Social History   Tobacco Use  . Smoking status: Former Smoker    Packs/day: 0.50    Types: Cigarettes    Last attempt to quit: 07/2017    Years since quitting: 1.4  . Smokeless tobacco: Never Used  Substance Use Topics  . Alcohol use: Yes    Comment: 3 months ago as of 08/03/17  . Drug use: Yes    Types: Marijuana    Comment: positive on 11/19/17     Allergies   Patient has no known allergies.   Review of Systems Review of Systems Ten systems reviewed and are negative for acute change, except as noted in the HPI.    Physical Exam Updated Vital Signs BP 124/70 (BP Location: Right Arm)   Pulse 99   Temp 98.6 F (37 C) (Oral)   Resp 14   LMP 11/18/2018   SpO2 100%   Physical  Exam Vitals signs and nursing note reviewed.  Constitutional:      General: She is not in acute distress.    Appearance: She is well-developed. She is not diaphoretic.  HENT:     Head: Normocephalic and atraumatic.  Eyes:     General: No scleral icterus.    Conjunctiva/sclera: Conjunctivae normal.  Neck:     Musculoskeletal: Normal range of motion.  Cardiovascular:     Rate and Rhythm: Normal rate and regular rhythm.     Heart sounds: Normal heart sounds. No murmur. No friction rub. No gallop.   Pulmonary:     Effort: Pulmonary effort is normal. No respiratory distress.     Breath sounds: Normal breath sounds.  Chest:    Abdominal:     General: Bowel sounds are normal. There is no distension.     Palpations: Abdomen is soft. There is no mass.     Tenderness: There is no abdominal tenderness. There is no guarding.  Musculoskeletal:     Comments: No midline spinal tenderness, tenderness of bilateral paraspinal muscles.  Full range of motion, tender with twisting flexion and extension  Skin:    General: Skin is warm and dry.  Neurological:     Mental Status: She is alert and oriented to person, place, and time.  Psychiatric:        Behavior: Behavior normal.      ED Treatments / Results  Labs (all labs ordered are listed, but only abnormal results are displayed) Labs Reviewed  CBC - Abnormal; Notable for the following components:      Result Value   Hemoglobin 10.7 (*)    HCT 34.3 (*)    MCV 79.2 (*)    MCH 24.7 (*)    RDW 15.8 (*)    All other components within normal limits  BASIC METABOLIC PANEL - Abnormal; Notable for the following components:   Sodium 134 (*)    CO2 21 (*)    BUN 5 (*)    Calcium 8.4 (*)    All other components within normal limits  URINALYSIS, ROUTINE W REFLEX MICROSCOPIC - Abnormal; Notable for the following components:   Color, Urine AMBER (*)    APPearance HAZY (*)    Specific Gravity, Urine 1.034 (*)    Protein, ur 30 (*)     Leukocytes,Ua TRACE (*)    Bacteria, UA RARE (*)    All other components within normal limits    EKG None  Radiology No results found.  Procedures Procedures (including critical care time)  INCISION AND DRAINAGE Performed by: Arthor Captain Consent: Verbal consent obtained. Risks and benefits: risks, benefits and alternatives were discussed Type: abscess  Body area: R areola  Anesthesia: local infiltration  Incision was made with a scalpel.  Local anesthetic: lidocaine 1% w/o epinephrine  Anesthetic total: 3 ml  Complexity: complex Blunt dissection to break up loculations  Drainage: purulent  Drainage amount: moderate   Patient tolerance: Patient tolerated the procedure well with no immediate complications.   Medications Ordered in ED Medications  lidocaine (PF) (XYLOCAINE) 1 % injection 5 mL (5 mLs Intradermal Given by Other 12/19/18 2048)  fosfomycin (MONUROL) packet 3 g (3 g Oral Given 12/19/18 2046)     Initial Impression / Assessment and Plan / ED Course  I have reviewed the triage vital signs and the nursing notes.  Pertinent labs & imaging results that were available during my care of the patient were reviewed by me and considered in my medical decision making (see chart for details).        29 year old female with Montgomery gland cyst of the right area Ola.  This was incised and drained with both sebaceous and purulent material.  She has no induration or surrounding signs of cellulitis.  She does not appear to need antibiotics.  Patient also complaining of back pain and fatigue.  Urine is positive for urinary tract infection.  Treated here with fosfomycin.  Discussed return precautions.  She does not have any signs of pyelonephritis may follow-up in the outpatient setting.  Final Clinical Impressions(s) / ED Diagnoses   Final diagnoses:  Acute cystitis without hematuria  Montgomery gland cyst of right breast    ED Discharge Orders    None         Arthor Captain, PA-C 12/19/18 2251    Rolan Bucco, MD 12/20/18 1226

## 2018-12-19 NOTE — Discharge Instructions (Addendum)
Contact a health care provider if:  Your symptoms do not get better after 1-2 days.  Your symptoms go away and then return.  Get help right away if you have:  Severe pain in your back or your lower abdomen.  A fever.  Nausea or vomiting.

## 2018-12-19 NOTE — ED Triage Notes (Signed)
Pt c/o back spasms x 1 year. Reports that she had an epidural last year and since then she has been having back pain, worsening in the last few weeks. Ambulatory without difficulty. Denies bowel/bladder changes.

## 2019-02-28 ENCOUNTER — Ambulatory Visit: Payer: Self-pay

## 2019-05-02 ENCOUNTER — Telehealth: Payer: Self-pay | Admitting: Family Medicine

## 2019-05-02 ENCOUNTER — Encounter: Payer: Self-pay | Admitting: Family Medicine

## 2019-05-02 NOTE — Progress Notes (Unsigned)
Patient ID: Laura Leonard, female   DOB: 09/10/1991, 28 y.o.   MRN: [id number]   Patient came in wanting to schedule an appointment to start care. Patient stated she had spoke to someone and she was told to bring her ultrasounds to the office so she can be scheduled. Patient stated that she did not have any medical records just the ultrasounds from [facility] in [phone]). Patient stated the she never gets prenatal care for her babies until the 3rd trimester. Spoke with Jeanett Schlein and she stated if the patient has medical records then just scheduled for a New OB appointment and skip the intake. If the patient does not have records then she will need to have an intake. Patient did not have records and was scheduled for the next intake appointment ( 8/11).

## 2019-05-03 NOTE — Telephone Encounter (Signed)
Opened in error

## 2019-05-10 ENCOUNTER — Inpatient Hospital Stay (HOSPITAL_BASED_OUTPATIENT_CLINIC_OR_DEPARTMENT_OTHER): Payer: Medicaid Other

## 2019-05-10 ENCOUNTER — Inpatient Hospital Stay (HOSPITAL_COMMUNITY)
Admission: AD | Admit: 2019-05-10 | Discharge: 2019-05-10 | Disposition: A | Discharge status: Home / Self Care | Payer: Medicaid Other | Attending: Obstetrics and Gynecology | Admitting: Obstetrics and Gynecology

## 2019-05-10 ENCOUNTER — Encounter (HOSPITAL_COMMUNITY): Payer: Self-pay

## 2019-05-10 ENCOUNTER — Other Ambulatory Visit: Payer: Self-pay

## 2019-05-10 DIAGNOSIS — O26893 Other specified pregnancy related conditions, third trimester: Secondary | ICD-10-CM | POA: Insufficient documentation

## 2019-05-10 DIAGNOSIS — O98313 Other infections with a predominantly sexual mode of transmission complicating pregnancy, third trimester: Secondary | ICD-10-CM | POA: Insufficient documentation

## 2019-05-10 DIAGNOSIS — O09213 Supervision of pregnancy with history of pre-term labor, third trimester: Secondary | ICD-10-CM

## 2019-05-10 DIAGNOSIS — O0933 Supervision of pregnancy with insufficient antenatal care, third trimester: Secondary | ICD-10-CM | POA: Diagnosis not present

## 2019-05-10 DIAGNOSIS — O4693 Antepartum hemorrhage, unspecified, third trimester: Secondary | ICD-10-CM

## 2019-05-10 DIAGNOSIS — A599 Trichomoniasis, unspecified: Secondary | ICD-10-CM | POA: Diagnosis not present

## 2019-05-10 DIAGNOSIS — O139 Gestational [pregnancy-induced] hypertension without significant proteinuria, unspecified trimester: Secondary | ICD-10-CM

## 2019-05-10 DIAGNOSIS — O1493 Unspecified pre-eclampsia, third trimester: Secondary | ICD-10-CM | POA: Diagnosis not present

## 2019-05-10 DIAGNOSIS — O34219 Maternal care for unspecified type scar from previous cesarean delivery: Secondary | ICD-10-CM

## 2019-05-10 DIAGNOSIS — Z87891 Personal history of nicotine dependence: Secondary | ICD-10-CM | POA: Insufficient documentation

## 2019-05-10 DIAGNOSIS — Z3A32 32 weeks gestation of pregnancy: Secondary | ICD-10-CM

## 2019-05-10 DIAGNOSIS — Z79899 Other long term (current) drug therapy: Secondary | ICD-10-CM | POA: Insufficient documentation

## 2019-05-10 DIAGNOSIS — Z3A33 33 weeks gestation of pregnancy: Secondary | ICD-10-CM | POA: Insufficient documentation

## 2019-05-10 DIAGNOSIS — O98813 Other maternal infectious and parasitic diseases complicating pregnancy, third trimester: Secondary | ICD-10-CM | POA: Diagnosis not present

## 2019-05-10 DIAGNOSIS — R51 Headache: Secondary | ICD-10-CM | POA: Diagnosis not present

## 2019-05-10 LAB — COMPREHENSIVE METABOLIC PANEL
ALT: 10 U/L (ref 0–44)
AST: 12 U/L — ABNORMAL LOW (ref 15–41)
Albumin: 2.6 g/dL — ABNORMAL LOW (ref 3.5–5.0)
Alkaline Phosphatase: 76 U/L (ref 38–126)
Anion gap: 7 (ref 5–15)
BUN: 8 mg/dL (ref 6–20)
CO2: 19 mmol/L — ABNORMAL LOW (ref 22–32)
Calcium: 8.1 mg/dL — ABNORMAL LOW (ref 8.9–10.3)
Chloride: 108 mmol/L (ref 98–111)
Creatinine, Ser: 0.45 mg/dL (ref 0.44–1.00)
GFR calc Af Amer: >60 mL/min (ref >60)
GFR calc non Af Amer: >60 mL/min (ref >60)
Glucose, Bld: 110 mg/dL — ABNORMAL HIGH (ref 70–99)
Potassium: 3.6 mmol/L (ref 3.5–5.1)
Sodium: 134 mmol/L — ABNORMAL LOW (ref 135–145)
Total Bilirubin: 0.3 mg/dL (ref 0.3–1.2)
Total Protein: 6.1 g/dL — ABNORMAL LOW (ref 6.5–8.1)

## 2019-05-10 LAB — GLUCOSE, CAPILLARY: Glucose-Capillary: 105 mg/dL — ABNORMAL HIGH (ref 70–99)

## 2019-05-10 LAB — RAPID URINE DRUG SCREEN, HOSP PERFORMED
Amphetamines: NOT DETECTED
Barbiturates: NOT DETECTED
Benzodiazepines: NOT DETECTED
Cocaine: NOT DETECTED
Opiates: NOT DETECTED
Tetrahydrocannabinol: POSITIVE — AB

## 2019-05-10 LAB — CBC
HCT: 27.6 % — ABNORMAL LOW (ref 36.0–46.0)
Hemoglobin: 8.7 g/dL — ABNORMAL LOW (ref 12.0–15.0)
MCH: 24.6 pg — ABNORMAL LOW (ref 26.0–34.0)
MCHC: 31.5 g/dL (ref 30.0–36.0)
MCV: 78 fL — ABNORMAL LOW (ref 80.0–100.0)
Platelets: 317 10*3/uL (ref 150–400)
RBC: 3.54 MIL/uL — ABNORMAL LOW (ref 3.87–5.11)
RDW: 14.1 % (ref 11.5–15.5)
WBC: 9.8 10*3/uL (ref 4.0–10.5)
nRBC: 0 % (ref 0.0–0.2)

## 2019-05-10 LAB — WET PREP, GENITAL
Clue Cells Wet Prep HPF POC: NONE SEEN
Sperm: NONE SEEN
Yeast Wet Prep HPF POC: NONE SEEN

## 2019-05-10 LAB — PROTEIN / CREATININE RATIO, URINE
Creatinine, Urine: 280.95 mg/dL
Protein Creatinine Ratio: 0.51 mg/mg{Cre} — ABNORMAL HIGH (ref 0.00–0.15)
Total Protein, Urine: 142 mg/dL

## 2019-05-10 MED ORDER — METRONIDAZOLE 500 MG PO TABS
2000.0000 mg | ORAL_TABLET | Freq: Once | ORAL | Status: AC
  Administered 2019-05-10: 2000 mg via ORAL
  Filled 2019-05-10: qty 4

## 2019-05-10 MED ORDER — RHO D IMMUNE GLOBULIN 1500 UNIT/2ML IJ SOSY
300.0000 ug | PREFILLED_SYRINGE | Freq: Once | INTRAMUSCULAR | Status: AC
  Administered 2019-05-10: 300 ug via INTRAVENOUS
  Filled 2019-05-10: qty 2

## 2019-05-10 NOTE — MAU Provider Note (Addendum)
Patient Hinda GlatterLavonnta R Reichardt is a 28 y.o. [phone] At [id number] here with complaints of vaginal bleeding. SHe has had no prenatal care in this pregnancy; she has a history of GHTN in 2017, resulting in IOL and subsequent [facility]. She then had an emergency c-section with breech twins in January, 2019 after she started laboring and had bulging membranes out of open cervix with breech presentation.   Today she denies abnormal discharge, decreased fetal movements, HA, blurry vision, floating spots, SOB, fever, chills. Patient states that she has been pregnant a lot and that's why she doesn't get prenatal care because "she knows what she's doing".    She states that "always has high blood pressure and blood sugar in pregnancy".    Review of chart shows that she failed her one hour in 2017, and had HgA1c of 6.2 on 11-19-2017. No documentation found of elevated blood pressures outside of pregnancy, other than elevated BPs in the immediate postpartum period in January 2019. She was discharged home on Norvasc but did not take it.    History     CSN: [id number]  Arrival date and time: 05/10/19 1305   First Provider Initiated Contact with Patient 05/10/19 1356      Chief Complaint  Patient presents with  . Vaginal Bleeding   Vaginal Bleeding The patient's primary symptoms include vaginal bleeding. This is a new problem. The current episode started today. The problem occurs constantly. The patient is experiencing no pain. Pertinent negatives include no constipation, diarrhea, headaches or urgency. The vaginal discharge was bloody. The vaginal bleeding is spotting. She has not been passing clots. Nothing aggravates the symptoms. She has tried nothing for the symptoms.    OB History    Gravida  5   Para  3   Term  2   Preterm  1   AB  1   Living  4     SAB  1   TAB  0   Ectopic      Multiple  1   Live Births  4           Past Medical History:  Diagnosis Date  . Anemia    . Depression   . Headache    hx of migraines  . Hypertension    with 2nd pregnancy  . Trichomonas contact     Past Surgical History:  Procedure Laterality Date  . CESAREAN SECTION N/A 05/26/2016   Procedure: CESAREAN SECTION;  Surgeon: Tilda BurrowJohn V Ferguson, MD;  [facility];  Service: Obstetrics;  Laterality: N/A;  . CESAREAN SECTION MULTI-GESTATIONAL  11/22/2017   Procedure: CESAREAN SECTION MULTI-GESTATIONAL;  Surgeon: Conan Bowensavis, Kelly M, MD;  [facility];  Service: Obstetrics;;    Family History  Problem Relation Age of Onset  . Diabetes Mother   . Hypertension Mother     Social History   Tobacco Use  . Smoking status: Former Smoker    Packs/day: 0.50    Types: Cigarettes    Quit date: 07/2017    Years since quitting: 1.7  . Smokeless tobacco: Never Used  Substance Use Topics  . Alcohol use: Yes    Comment: 3 months ago as of 08/03/17  . Drug use: Not Currently    Types: Marijuana    Comment: positive on 11/19/17    Allergies: No Known Allergies  Medications Prior to Admission  Medication Sig Dispense Refill Last Dose  . acetaminophen (TYLENOL) 500 MG tablet Take 500 mg by  mouth every 6 (six) hours as needed for moderate pain.   More than a month at Unknown time  . amLODipine (NORVASC) 5 MG tablet Take 1 tablet (5 mg total) by mouth daily. 30 tablet 0   . ibuprofen (ADVIL,MOTRIN) 600 MG tablet Take 1 tablet (600 mg total) by mouth every 6 (six) hours. 30 tablet 0   . oxyCODONE (OXY IR/ROXICODONE) 5 MG immediate release tablet Take 1 tablet (5 mg total) by mouth every 4 (four) hours as needed (pain scale 4-7). 20 tablet 0     Review of Systems  Constitutional: Negative.   HENT: Negative.   Respiratory: Negative.   Cardiovascular: Negative.   Gastrointestinal: Negative for constipation and diarrhea.  Genitourinary: Positive for vaginal bleeding. Negative for urgency.  Neurological: Negative for headaches.  Psychiatric/Behavioral:  Negative.    Physical Exam   Blood pressure (!) 136/97, pulse 93, temperature 97.7 F (36.5 C), resp. rate 12, last menstrual period 09/24/2018, SpO2 98 %, unknown if currently breastfeeding.  Physical Exam  Constitutional: She is oriented to person, place, and time. She appears well-developed.  HENT:  Head: Normocephalic.  Neck: Normal range of motion.  GI: Soft.  Genitourinary:    Vagina normal.     Genitourinary Comments: NEFG, no blood in the vagina. No lesions or mucous; vagina is pink and ruggated. No CMT, cervix is closed, long, thick.    Musculoskeletal: Normal range of motion.  Neurological: She is alert and oriented to person, place, and time.  Skin: Skin is warm.  Psychiatric: She has a normal mood and affect.    MAU Course  Procedures  MDM Mildly elevated blood pressures today in MAU. PCR Is 0.51; platelets are normal, however, patient is anemic and will need outpatient feraheme. Unclear if High BPS indicate new preeclampsia due to proteinuria or if patient is a chronic hypertensive with SIPE.  -CMP normal.  -POCT blood glucose (non-fasting) is 105 -UDS pending -No explanation for vaginal bleeding other than possible physiologic changes of pregnancy.  Patient Vitals for the past 24 hrs:  BP Temp Pulse Resp SpO2  05/10/19 1516 130/85 - 76 - -  05/10/19 1501 104/63 - 84 - -  05/10/19 1445 124/63 - 83 - -  05/10/19 1431 133/84 - 85 - -  05/10/19 1420 (!) 139/98 - (!) 104 - 100 %  05/10/19 1400 126/78 - 83 - 100 %  05/10/19 1333 (!) 136/97 - 93 - -  05/10/19 1322 (!) 138/94 - (!) 123 - -  05/10/19 1321 (!) 138/94 97.7 F (36.5 C) (!) 127 12 98 %  -NST: 135 bpm, mod var, present acel, neg decels , no contractions; patient felt strong fetal movement while in MAU.  -Cervix is closed, long and thick -US shows no evidence of previa; normal growth and EDD of 07-01-2019 and no abruption. Growth is consistent with LMP.  -Rhogam given -2 grams of Flagyl given in MAU, plus  partner notification due to positive trichomoniasis.  -BPP not done as patient had reactive NST over prolonged period in MAU - Plan of care discussed with MD  Assessment and Plan   1. Trichomoniasis    -will ask [facility] to order feraheme infusion x 2.  -will send message to [facility] to schedule patient for NOB in person as soon as possible, as well as [facility] and sign BTL papers.  -long discussion at bedside about the importance of keeping her prenatal visits. Explained that diabetes and  HTN are risk factors for fetal death and significant injury to her, and that we want to monitor her closely.  -She will need a C-section at 37 weeks; we will get that scheduled as soon as possible. Message sent to [facility] to schedule.  -RX for iron given -Patient verbalized understanding of plan of care.  Mervyn Skeeters Kooistra 05/10/2019, 2:18 PM

## 2019-05-10 NOTE — MAU Note (Signed)
Pt states that for the last 3 days when she wipes she sees blood.   Pt does not have to wear a pad.   Reports +FM   Denies LOF.   Pt states she has had no prenatal care. She states she goes to the ED if she needs to get checked out for anything.  She paid [facility].

## 2019-05-10 NOTE — Discharge Instructions (Signed)
-someone will call you about an US; we want you to have an US as soon as possible -someone will call you to move up your new ob appt as soon as possible - Preeclampsia and Eclampsia Preeclampsia is a serious condition that may develop during pregnancy. This condition causes high blood pressure and increased protein in your urine along with other symptoms, such as headaches and vision changes. These symptoms may develop as the condition gets worse. Preeclampsia may occur at 20 weeks of pregnancy or later. Diagnosing and treating preeclampsia early is very important. If not treated early, it can cause serious problems for you and your baby. One problem it can lead to is eclampsia. Eclampsia is a condition that causes muscle jerking or shaking (convulsions or seizures) and other serious problems for the mother. During pregnancy, delivering your baby may be the best treatment for preeclampsia or eclampsia. For most women, preeclampsia and eclampsia symptoms go away after giving birth. In rare cases, a woman may develop preeclampsia after giving birth (postpartum preeclampsia). This usually occurs within 48 hours after childbirth but may occur up to 6 weeks after giving birth. What are the causes? The cause of preeclampsia is not known. What increases the risk? The following risk factors make you more likely to develop preeclampsia:  Being pregnant for the first time.  Having had preeclampsia during a past pregnancy.  Having a family history of preeclampsia.  Having high blood pressure.  Being pregnant with more than one baby.  Being 7335 or older.  Being African-American.  Having kidney disease or diabetes.  Having medical conditions such as lupus or blood diseases.  Being very overweight (obese). What are the signs or symptoms? The most common symptoms are:  Severe headaches.  Vision problems, such as blurred or double vision.  Abdominal pain, especially upper abdominal pain. Other  symptoms that may develop as the condition gets worse include:  Sudden weight gain.  Sudden swelling of the hands, face, legs, and feet.  Severe nausea and vomiting.  Numbness in the face, arms, legs, and feet.  Dizziness.  Urinating less than usual.  Slurred speech.  Convulsions or seizures. How is this diagnosed? There are no screening tests for preeclampsia. Your health care provider will ask you about symptoms and check for signs of preeclampsia during your prenatal visits. You may also have tests that include:  Checking your blood pressure.  Urine tests to check for protein. Your health care provider will check for this at every prenatal visit.  Blood tests.  Monitoring your baby's heart rate.  Ultrasound. How is this treated? You and your health care provider will determine the treatment approach that is best for you. Treatment may include:  Having more frequent prenatal exams to check for signs of preeclampsia, if you have an increased risk for preeclampsia.  Medicine to lower your blood pressure.  Staying in the hospital, if your condition is severe. There, treatment will focus on controlling your blood pressure and the amount of fluids in your body (fluid retention).  Taking medicine (magnesium sulfate) to prevent seizures. This may be given as an injection or through an IV.  Taking a low-dose aspirin during your pregnancy.  Delivering your baby early. You may have your labor started with medicine (induced), or you may have a cesarean delivery. Follow these instructions at home: Eating and drinking   Drink enough fluid to keep your urine pale yellow.  Avoid caffeine. Lifestyle  Do not use any products that contain nicotine  or tobacco, such as cigarettes and e-cigarettes. If you need help quitting, ask your health care provider.  Do not use alcohol or drugs.  Avoid stress as much as possible. Rest and get plenty of sleep. General instructions  Take  over-the-counter and prescription medicines only as told by your health care provider.  When lying down, lie on your left side. This keeps pressure off your major blood vessels.  When sitting or lying down, raise (elevate) your feet. Try putting some pillows underneath your lower legs.  Exercise regularly. Ask your health care provider what kinds of exercise are best for you.  Keep all follow-up and prenatal visits as told by your health care provider. This is important. How is this prevented? There is no known way of preventing preeclampsia or eclampsia from developing. However, to lower your risk of complications and detect problems early:  Get regular prenatal care. Your health care provider may be able to diagnose and treat the condition early.  Maintain a healthy weight. Ask your health care provider for help managing weight gain during pregnancy.  Work with your health care provider to manage any long-term (chronic) health conditions you have, such as diabetes or kidney problems.  You may have tests of your blood pressure and kidney function after giving birth.  Your health care provider may have you take low-dose aspirin during your next pregnancy. Contact a health care provider if:  You have symptoms that your health care provider told you may require more treatment or monitoring, such as: ? Headaches. ? Nausea or vomiting. ? Abdominal pain. ? Dizziness. ? Light-headedness. Get help right away if:  You have severe: ? Abdominal pain. ? Headaches that do not get better. ? Dizziness. ? Vision problems. ? Confusion. ? Nausea or vomiting.  You have any of the following: ? A seizure. ? Sudden, rapid weight gain. ? Sudden swelling in your hands, ankles, or face. ? Trouble moving any part of your body. ? Numbness in any part of your body. ? Trouble speaking. ? Abnormal bleeding.  You faint. Summary  Preeclampsia is a serious condition that may develop during  pregnancy.  This condition causes high blood pressure and increased protein in your urine along with other symptoms, such as headaches and vision changes.  Diagnosing and treating preeclampsia early is very important. If not treated early, it can cause serious problems for you and your baby.  Get help right away if you have symptoms that your health care provider told you to watch for. This information is not intended to replace advice given to you by your health care provider. Make sure you discuss any questions you have with your health care provider. Document Released: 10/08/2000 Document Revised: 06/13/2018 Document Reviewed: 05/17/2016 Elsevier Patient Education  2020 [employer].

## 2019-05-11 ENCOUNTER — Other Ambulatory Visit: Payer: Self-pay | Admitting: Student

## 2019-05-11 LAB — RH IG WORKUP (INCLUDES ABO/RH)
ABO/RH(D): B NEG
Antibody Screen: NEGATIVE
Fetal Screen: NEGATIVE
Gestational Age(Wks): 32.4
Unit division: 0

## 2019-05-11 MED ORDER — FERROUS FUMARATE 325 (106 FE) MG PO TABS: 1.0000 | ORAL_TABLET | Freq: Every day | ORAL | 0 refills | Status: DC

## 2019-05-14 ENCOUNTER — Telehealth: Payer: Self-pay | Admitting: Family Medicine

## 2019-05-14 NOTE — Telephone Encounter (Signed)
Patient called in wanting to know if she could push back her c-section date. Patient stated that her daughter's birthday is 2 days before and it is not fair. Spoke with Elmyra Ricks to see if the c-section could be pushed back and she spoke with patient about why it is important to keep the c-section date that she is already scheduled for.

## 2019-05-15 LAB — GC/CHLAMYDIA PROBE AMP (~~LOC~~) NOT AT ARMC
Chlamydia: NEGATIVE
Neisseria Gonorrhea: NEGATIVE

## 2019-05-16 ENCOUNTER — Other Ambulatory Visit: Payer: Self-pay

## 2019-05-18 ENCOUNTER — Other Ambulatory Visit (HOSPITAL_COMMUNITY): Payer: Self-pay | Admitting: *Deleted

## 2019-05-21 ENCOUNTER — Other Ambulatory Visit: Payer: Self-pay | Admitting: *Deleted

## 2019-05-21 ENCOUNTER — Inpatient Hospital Stay (HOSPITAL_COMMUNITY)
Admission: RE | Admit: 2019-05-21 | Discharge: 2019-05-21 | Disposition: A | Discharge status: Home / Self Care | Payer: Self-pay | Source: Ambulatory Visit | Attending: Student | Admitting: Student

## 2019-05-21 DIAGNOSIS — Z98891 History of uterine scar from previous surgery: Secondary | ICD-10-CM

## 2019-05-21 DIAGNOSIS — O0933 Supervision of pregnancy with insufficient antenatal care, third trimester: Secondary | ICD-10-CM

## 2019-05-21 DIAGNOSIS — O139 Gestational [pregnancy-induced] hypertension without significant proteinuria, unspecified trimester: Secondary | ICD-10-CM

## 2019-05-23 ENCOUNTER — Encounter: Payer: Self-pay | Admitting: Obstetrics and Gynecology

## 2019-05-23 ENCOUNTER — Other Ambulatory Visit: Payer: Self-pay

## 2019-05-23 ENCOUNTER — Telehealth: Payer: Self-pay | Admitting: Obstetrics and Gynecology

## 2019-05-23 NOTE — Telephone Encounter (Signed)
Attempted to call patient to get her rescheduled for her missed new ob appointment. No answer, and could not leave a voicemail because it was not set-up. No show letter mailed

## 2019-05-28 ENCOUNTER — Encounter (HOSPITAL_COMMUNITY): Payer: Self-pay

## 2019-05-28 ENCOUNTER — Encounter: Payer: Self-pay | Admitting: *Deleted

## 2019-06-01 ENCOUNTER — Encounter (HOSPITAL_COMMUNITY): Payer: Self-pay

## 2019-06-01 NOTE — Patient Instructions (Signed)
DARCELLA SHIFFMAN  06/01/2019   Your procedure is scheduled on:  06/11/2019  Arrive at 38 at Entrance C on [address] at [facility]  and [employer]. You are invited to use the FREE valet parking or use the Visitor's parking deck.  Pick up the phone at the desk and dial [phone].  Call this number if you have problems the morning of surgery: [phone]  Remember:   Do not eat food:(After Midnight) Desps de medianoche.  Do not drink clear liquids: (After Midnight) Desps de medianoche.  Take these medicines the morning of surgery with A SIP OF WATER:  norvasc   Do not wear jewelry, make-up or nail polish.  Do not wear lotions, powders, or perfumes. Do not wear deodorant.  Do not shave 48 hours prior to surgery.  Do not bring valuables to the hospital.  [facility] is not   responsible for any belongings or valuables brought to the hospital.  Contacts, dentures or bridgework may not be worn into surgery.  Leave suitcase in the car. After surgery it may be brought to your room.  For patients admitted to the hospital, checkout time is 11:00 AM the day of              discharge.      Please read over the following fact sheets that you were given:     Preparing for Surgery

## 2019-06-04 ENCOUNTER — Telehealth: Payer: Self-pay | Admitting: Family Medicine

## 2019-06-04 NOTE — Telephone Encounter (Signed)
Called the patient to schedule an upcoming appointment due to in basket message with no prenatal care. The patient has a voicemail box that is not setup yet. Unable to reach.

## 2019-06-09 ENCOUNTER — Other Ambulatory Visit (HOSPITAL_COMMUNITY)
Admission: RE | Admit: 2019-06-09 | Discharge: 2019-06-09 | Disposition: A | Discharge status: Home / Self Care | Payer: Self-pay | Source: Ambulatory Visit

## 2019-06-09 HISTORY — DX: Gestational diabetes mellitus in pregnancy, unspecified control: O24.419

## 2019-06-09 HISTORY — DX: Gestational (pregnancy-induced) hypertension without significant proteinuria, unspecified trimester: O13.9

## 2019-06-10 NOTE — Anesthesia Preprocedure Evaluation (Addendum)
Anesthesia Evaluation  Patient identified by MRN, date of birth, ID band Patient awake    Reviewed: Allergy & Precautions, Patient's Chart, lab work & pertinent test results  History of Anesthesia Complications Negative for: history of anesthetic complications  Airway Mallampati: II  TM Distance: >3 FB Neck ROM: Full    Dental no notable dental hx.    Pulmonary former smoker,    Pulmonary exam normal        Cardiovascular hypertension, Normal cardiovascular exam     Neuro/Psych negative neurological ROS     GI/Hepatic negative GI ROS, Neg liver ROS,   Endo/Other  diabetes, Gestational  Renal/GU negative Renal ROS     Musculoskeletal negative musculoskeletal ROS (+)   Abdominal   Peds  Hematology  (+) anemia ,   Anesthesia Other Findings Day of surgery medications reviewed with the patient.  Reproductive/Obstetrics (+) Pregnancy Hx of C/S x2                           Anesthesia Physical Anesthesia Plan  ASA: III  Anesthesia Plan: Combined Spinal and Epidural   Post-op Pain Management:    Induction:   PONV Risk Score and Plan: 3 and Treatment may vary due to age or medical condition, Ondansetron and Dexamethasone  Airway Management Planned: Natural Airway  Additional Equipment:   Intra-op Plan:   Post-operative Plan:   Informed Consent: I have reviewed the patients History and Physical, chart, labs and discussed the procedure including the risks, benefits and alternatives for the proposed anesthesia with the patient or authorized representative who has indicated his/her understanding and acceptance.     Dental advisory given  Plan Discussed with: CRNA  Anesthesia Plan Comments:       Anesthesia Quick Evaluation

## 2019-06-11 ENCOUNTER — Inpatient Hospital Stay (HOSPITAL_COMMUNITY): Payer: Medicaid Other | Admitting: Anesthesiology

## 2019-06-11 ENCOUNTER — Other Ambulatory Visit: Payer: Self-pay

## 2019-06-11 ENCOUNTER — Encounter (HOSPITAL_COMMUNITY): Payer: Self-pay | Admitting: *Deleted

## 2019-06-11 ENCOUNTER — Encounter (HOSPITAL_COMMUNITY)
Admission: RE | Disposition: A | Discharge status: Home / Self Care | Payer: Self-pay | Source: Home / Self Care | Attending: Obstetrics and Gynecology

## 2019-06-11 ENCOUNTER — Inpatient Hospital Stay (HOSPITAL_COMMUNITY)
Admission: RE | Admit: 2019-06-11 | Discharge: 2019-06-14 | DRG: 787 | Disposition: A | Discharge status: Home / Self Care | Payer: Medicaid Other | Attending: Obstetrics and Gynecology | Admitting: Obstetrics and Gynecology

## 2019-06-11 DIAGNOSIS — O1404 Mild to moderate pre-eclampsia, complicating childbirth: Secondary | ICD-10-CM | POA: Diagnosis present

## 2019-06-11 DIAGNOSIS — Z98891 History of uterine scar from previous surgery: Secondary | ICD-10-CM

## 2019-06-11 DIAGNOSIS — Z3A37 37 weeks gestation of pregnancy: Secondary | ICD-10-CM

## 2019-06-11 DIAGNOSIS — O9081 Anemia of the puerperium: Secondary | ICD-10-CM | POA: Diagnosis not present

## 2019-06-11 DIAGNOSIS — O34212 Maternal care for vertical scar from previous cesarean delivery: Secondary | ICD-10-CM | POA: Diagnosis present

## 2019-06-11 DIAGNOSIS — Z6791 Unspecified blood type, Rh negative: Secondary | ICD-10-CM

## 2019-06-11 DIAGNOSIS — D62 Acute posthemorrhagic anemia: Secondary | ICD-10-CM | POA: Diagnosis not present

## 2019-06-11 DIAGNOSIS — O34211 Maternal care for low transverse scar from previous cesarean delivery: Secondary | ICD-10-CM | POA: Diagnosis not present

## 2019-06-11 DIAGNOSIS — Z87891 Personal history of nicotine dependence: Secondary | ICD-10-CM

## 2019-06-11 DIAGNOSIS — O360130 Maternal care for anti-D [Rh] antibodies, third trimester, not applicable or unspecified: Secondary | ICD-10-CM | POA: Diagnosis not present

## 2019-06-11 DIAGNOSIS — Z20828 Contact with and (suspected) exposure to other viral communicable diseases: Secondary | ICD-10-CM | POA: Diagnosis present

## 2019-06-11 DIAGNOSIS — O26893 Other specified pregnancy related conditions, third trimester: Secondary | ICD-10-CM | POA: Diagnosis present

## 2019-06-11 LAB — DIFFERENTIAL
Abs Immature Granulocytes: 0.04 10*3/uL (ref 0.00–0.07)
Basophils Absolute: 0 10*3/uL (ref 0.0–0.1)
Basophils Relative: 0 %
Eosinophils Absolute: 0.1 10*3/uL (ref 0.0–0.5)
Eosinophils Relative: 1 %
Immature Granulocytes: 0 %
Lymphocytes Relative: 17 %
Lymphs Abs: 1.8 10*3/uL (ref 0.7–4.0)
Monocytes Absolute: 0.9 10*3/uL (ref 0.1–1.0)
Monocytes Relative: 9 %
Neutro Abs: 8 10*3/uL — ABNORMAL HIGH (ref 1.7–7.7)
Neutrophils Relative %: 73 %

## 2019-06-11 LAB — CBC
HCT: 27.4 % — ABNORMAL LOW (ref 36.0–46.0)
Hemoglobin: 8.5 g/dL — ABNORMAL LOW (ref 12.0–15.0)
MCH: 23.3 pg — ABNORMAL LOW (ref 26.0–34.0)
MCHC: 31 g/dL (ref 30.0–36.0)
MCV: 75.1 fL — ABNORMAL LOW (ref 80.0–100.0)
Platelets: 312 10*3/uL (ref 150–400)
RBC: 3.65 MIL/uL — ABNORMAL LOW (ref 3.87–5.11)
RDW: 15.5 % (ref 11.5–15.5)
WBC: 10.8 10*3/uL — ABNORMAL HIGH (ref 4.0–10.5)
nRBC: 0 % (ref 0.0–0.2)

## 2019-06-11 LAB — RAPID HIV SCREEN (HIV 1/2 AB+AG)
HIV 1/2 Antibodies: NONREACTIVE
HIV-1 P24 Antigen - HIV24: NONREACTIVE

## 2019-06-11 LAB — COMPREHENSIVE METABOLIC PANEL
ALT: 10 U/L (ref 0–44)
AST: 12 U/L — ABNORMAL LOW (ref 15–41)
Albumin: 2.5 g/dL — ABNORMAL LOW (ref 3.5–5.0)
Alkaline Phosphatase: 91 U/L (ref 38–126)
Anion gap: 11 (ref 5–15)
BUN: <5 mg/dL — ABNORMAL LOW (ref 6–20)
CO2: 20 mmol/L — ABNORMAL LOW (ref 22–32)
Calcium: 7.9 mg/dL — ABNORMAL LOW (ref 8.9–10.3)
Chloride: 106 mmol/L (ref 98–111)
Creatinine, Ser: 0.53 mg/dL (ref 0.44–1.00)
GFR calc Af Amer: >60 mL/min (ref >60)
GFR calc non Af Amer: >60 mL/min (ref >60)
Glucose, Bld: 87 mg/dL (ref 70–99)
Potassium: 3.5 mmol/L (ref 3.5–5.1)
Sodium: 137 mmol/L (ref 135–145)
Total Bilirubin: 0.4 mg/dL (ref 0.3–1.2)
Total Protein: 6.1 g/dL — ABNORMAL LOW (ref 6.5–8.1)

## 2019-06-11 LAB — RAPID URINE DRUG SCREEN, HOSP PERFORMED
Amphetamines: NOT DETECTED
Barbiturates: NOT DETECTED
Benzodiazepines: NOT DETECTED
Cocaine: NOT DETECTED
Opiates: NOT DETECTED
Tetrahydrocannabinol: POSITIVE — AB

## 2019-06-11 LAB — GLUCOSE, CAPILLARY
Glucose-Capillary: 108 mg/dL — ABNORMAL HIGH (ref 70–99)
Glucose-Capillary: 101 mg/dL — ABNORMAL HIGH (ref 70–99)

## 2019-06-11 LAB — HEMOGLOBIN A1C
Hgb A1c MFr Bld: 6.6 % — ABNORMAL HIGH (ref 4.8–5.6)
Mean Plasma Glucose: 142.72 mg/dL

## 2019-06-11 LAB — GROUP B STREP BY PCR: Group B strep by PCR: NEGATIVE

## 2019-06-11 LAB — SARS CORONAVIRUS 2 BY RT PCR (HOSPITAL ORDER, PERFORMED IN ~~LOC~~ HOSPITAL LAB): SARS Coronavirus 2: NEGATIVE

## 2019-06-11 LAB — PREPARE RBC (CROSSMATCH)

## 2019-06-11 SURGERY — Surgical Case
Anesthesia: Spinal | Site: Abdomen | Wound class: Clean Contaminated

## 2019-06-11 MED ORDER — SODIUM CHLORIDE 0.9 % IR SOLN
Status: DC | PRN
  Administered 2019-06-11: 1

## 2019-06-11 MED ORDER — DEXAMETHASONE SODIUM PHOSPHATE 4 MG/ML IJ SOLN
INTRAMUSCULAR | Status: DC | PRN
  Administered 2019-06-11: 4 mg via INTRAVENOUS

## 2019-06-11 MED ORDER — WITCH HAZEL-GLYCERIN EX PADS: 1.0000 "application " | MEDICATED_PAD | CUTANEOUS | Status: DC | PRN

## 2019-06-11 MED ORDER — OXYTOCIN 40 UNITS IN NORMAL SALINE INFUSION - SIMPLE MED: 2.5000 [IU]/h | INTRAVENOUS | Status: AC

## 2019-06-11 MED ORDER — KETOROLAC TROMETHAMINE 30 MG/ML IJ SOLN
30.0000 mg | Freq: Once | INTRAMUSCULAR | Status: AC
  Administered 2019-06-11: 30 mg via INTRAVENOUS

## 2019-06-11 MED ORDER — MENTHOL 3 MG MT LOZG: 1.0000 | LOZENGE | OROMUCOSAL | Status: DC | PRN

## 2019-06-11 MED ORDER — DIPHENHYDRAMINE HCL 25 MG PO CAPS: 25.0000 mg | ORAL_CAPSULE | ORAL | Status: DC | PRN

## 2019-06-11 MED ORDER — MORPHINE SULFATE (PF) 0.5 MG/ML IJ SOLN
INTRAMUSCULAR | Status: AC
  Filled 2019-06-11: qty 10

## 2019-06-11 MED ORDER — DIPHENHYDRAMINE HCL 50 MG/ML IJ SOLN: 12.5000 mg | INTRAMUSCULAR | Status: DC | PRN

## 2019-06-11 MED ORDER — ACETAMINOPHEN 500 MG PO TABS: 1000.0000 mg | ORAL_TABLET | Freq: Once | ORAL | Status: DC

## 2019-06-11 MED ORDER — ONDANSETRON HCL 4 MG/2ML IJ SOLN: 4.0000 mg | Freq: Three times a day (TID) | INTRAMUSCULAR | Status: DC | PRN

## 2019-06-11 MED ORDER — PRENATAL MULTIVITAMIN CH
1.0000 | ORAL_TABLET | Freq: Every day | ORAL | Status: DC
  Administered 2019-06-12 – 2019-06-14 (×3): 1 via ORAL
  Filled 2019-06-11 (×3): qty 1

## 2019-06-11 MED ORDER — FENTANYL CITRATE (PF) 100 MCG/2ML IJ SOLN
INTRAMUSCULAR | Status: AC
  Filled 2019-06-11: qty 2

## 2019-06-11 MED ORDER — KETOROLAC TROMETHAMINE 30 MG/ML IJ SOLN: 30.0000 mg | Freq: Four times a day (QID) | INTRAMUSCULAR | Status: DC | PRN

## 2019-06-11 MED ORDER — ONDANSETRON HCL 4 MG/2ML IJ SOLN
INTRAMUSCULAR | Status: AC
  Filled 2019-06-11: qty 2

## 2019-06-11 MED ORDER — ZOLPIDEM TARTRATE 5 MG PO TABS: 5.0000 mg | ORAL_TABLET | Freq: Every evening | ORAL | Status: DC | PRN

## 2019-06-11 MED ORDER — SODIUM CHLORIDE 0.9 % IV SOLN
INTRAVENOUS | Status: DC | PRN
  Administered 2019-06-11: 16:00:00 40 [IU] via INTRAVENOUS

## 2019-06-11 MED ORDER — BUPIVACAINE IN DEXTROSE 0.75-8.25 % IT SOLN
INTRATHECAL | Status: AC
  Filled 2019-06-11: qty 2

## 2019-06-11 MED ORDER — ONDANSETRON HCL 4 MG/2ML IJ SOLN
INTRAMUSCULAR | Status: DC | PRN
  Administered 2019-06-11: 4 mg via INTRAVENOUS

## 2019-06-11 MED ORDER — SODIUM CHLORIDE 0.9 % IV SOLN
INTRAVENOUS | Status: DC | PRN
  Administered 2019-06-11: 16:00:00 via INTRAVENOUS

## 2019-06-11 MED ORDER — SODIUM CHLORIDE 0.9% FLUSH: 3.0000 mL | INTRAVENOUS | Status: DC | PRN

## 2019-06-11 MED ORDER — KETOROLAC TROMETHAMINE 30 MG/ML IJ SOLN
30.0000 mg | Freq: Four times a day (QID) | INTRAMUSCULAR | Status: AC
  Administered 2019-06-12: 30 mg via INTRAVENOUS
  Filled 2019-06-11: qty 1

## 2019-06-11 MED ORDER — STERILE WATER FOR IRRIGATION IR SOLN
Status: DC | PRN
  Administered 2019-06-11: 1

## 2019-06-11 MED ORDER — SOD CITRATE-CITRIC ACID 500-334 MG/5ML PO SOLN
30.0000 mL | ORAL | Status: AC
  Administered 2019-06-11: 30 mL via ORAL
  Filled 2019-06-11: qty 30

## 2019-06-11 MED ORDER — SIMETHICONE 80 MG PO CHEW
80.0000 mg | CHEWABLE_TABLET | ORAL | Status: DC
  Administered 2019-06-12 – 2019-06-13 (×3): 80 mg via ORAL
  Filled 2019-06-11 (×3): qty 1

## 2019-06-11 MED ORDER — NALBUPHINE HCL 10 MG/ML IJ SOLN: 5.0000 mg | INTRAMUSCULAR | Status: DC | PRN

## 2019-06-11 MED ORDER — DEXAMETHASONE SODIUM PHOSPHATE 4 MG/ML IJ SOLN
INTRAMUSCULAR | Status: AC
  Filled 2019-06-11: qty 1

## 2019-06-11 MED ORDER — NALBUPHINE HCL 10 MG/ML IJ SOLN
5.0000 mg | INTRAMUSCULAR | Status: DC | PRN
  Administered 2019-06-11: 5 mg via INTRAVENOUS
  Filled 2019-06-11: qty 1

## 2019-06-11 MED ORDER — LACTATED RINGERS IV SOLN
INTRAVENOUS | Status: DC
  Administered 2019-06-11: 19:00:00 125 mL/h via INTRAVENOUS

## 2019-06-11 MED ORDER — IBUPROFEN 800 MG PO TABS
800.0000 mg | ORAL_TABLET | Freq: Four times a day (QID) | ORAL | Status: DC | PRN
  Administered 2019-06-12 – 2019-06-14 (×7): 800 mg via ORAL
  Filled 2019-06-11 (×7): qty 1

## 2019-06-11 MED ORDER — FENTANYL CITRATE (PF) 100 MCG/2ML IJ SOLN
INTRAMUSCULAR | Status: DC | PRN
  Administered 2019-06-11: 15 ug via INTRATHECAL
  Administered 2019-06-11: 50 ug via INTRAVENOUS
  Administered 2019-06-11: 35 ug via INTRAVENOUS

## 2019-06-11 MED ORDER — BUPIVACAINE IN DEXTROSE 0.75-8.25 % IT SOLN
INTRATHECAL | Status: DC | PRN
  Administered 2019-06-11: 1.5 mg via INTRATHECAL

## 2019-06-11 MED ORDER — OXYCODONE-ACETAMINOPHEN 5-325 MG PO TABS
1.0000 | ORAL_TABLET | ORAL | Status: DC | PRN
  Administered 2019-06-12 – 2019-06-14 (×2): 1 via ORAL
  Filled 2019-06-11: qty 1
  Filled 2019-06-11: qty 2
  Filled 2019-06-11: qty 1

## 2019-06-11 MED ORDER — DIBUCAINE (PERIANAL) 1 % EX OINT: 1.0000 "application " | TOPICAL_OINTMENT | CUTANEOUS | Status: DC | PRN

## 2019-06-11 MED ORDER — TETANUS-DIPHTH-ACELL PERTUSSIS 5-2.5-18.5 LF-MCG/0.5 IM SUSP: 0.5000 mL | Freq: Once | INTRAMUSCULAR | Status: DC

## 2019-06-11 MED ORDER — BUPIVACAINE HCL (PF) 0.5 % IJ SOLN
INTRAMUSCULAR | Status: DC | PRN
  Administered 2019-06-11: 30 mL

## 2019-06-11 MED ORDER — MORPHINE SULFATE (PF) 0.5 MG/ML IJ SOLN
INTRAMUSCULAR | Status: DC | PRN
  Administered 2019-06-11: .15 mg via INTRATHECAL

## 2019-06-11 MED ORDER — COCONUT OIL OIL: 1.0000 "application " | TOPICAL_OIL | Status: DC | PRN

## 2019-06-11 MED ORDER — ACETAMINOPHEN 160 MG/5ML PO SOLN: 1000.0000 mg | Freq: Once | ORAL | Status: DC

## 2019-06-11 MED ORDER — LACTATED RINGERS IV SOLN
INTRAVENOUS | Status: DC | PRN
  Administered 2019-06-11 (×2): via INTRAVENOUS

## 2019-06-11 MED ORDER — ACETAMINOPHEN 500 MG PO TABS
1000.0000 mg | ORAL_TABLET | Freq: Four times a day (QID) | ORAL | Status: AC
  Administered 2019-06-11 – 2019-06-12 (×3): 1000 mg via ORAL
  Filled 2019-06-11 (×4): qty 2

## 2019-06-11 MED ORDER — SENNOSIDES-DOCUSATE SODIUM 8.6-50 MG PO TABS
2.0000 | ORAL_TABLET | ORAL | Status: DC
  Administered 2019-06-12 – 2019-06-13 (×3): 2 via ORAL
  Filled 2019-06-11 (×3): qty 2

## 2019-06-11 MED ORDER — KETOROLAC TROMETHAMINE 30 MG/ML IJ SOLN: 30.0000 mg | Freq: Four times a day (QID) | INTRAMUSCULAR | Status: DC

## 2019-06-11 MED ORDER — ENOXAPARIN SODIUM 60 MG/0.6ML ~~LOC~~ SOLN
50.0000 mg | SUBCUTANEOUS | Status: DC
  Administered 2019-06-12 – 2019-06-14 (×3): 50 mg via SUBCUTANEOUS
  Filled 2019-06-11 (×3): qty 0.6

## 2019-06-11 MED ORDER — NALOXONE HCL 4 MG/10ML IJ SOLN
1.0000 ug/kg/h | INTRAVENOUS | Status: DC | PRN
  Filled 2019-06-11: qty 5

## 2019-06-11 MED ORDER — OXYTOCIN 40 UNITS IN NORMAL SALINE INFUSION - SIMPLE MED
INTRAVENOUS | Status: AC
  Filled 2019-06-11: qty 1000

## 2019-06-11 MED ORDER — KETOROLAC TROMETHAMINE 30 MG/ML IJ SOLN
INTRAMUSCULAR | Status: AC
  Filled 2019-06-11: qty 1

## 2019-06-11 MED ORDER — NALBUPHINE HCL 10 MG/ML IJ SOLN: 5.0000 mg | Freq: Once | INTRAMUSCULAR | Status: DC | PRN

## 2019-06-11 MED ORDER — NALOXONE HCL 0.4 MG/ML IJ SOLN: 0.4000 mg | INTRAMUSCULAR | Status: DC | PRN

## 2019-06-11 MED ORDER — CEFAZOLIN SODIUM-DEXTROSE 2-4 GM/100ML-% IV SOLN
2.0000 g | INTRAVENOUS | Status: AC
  Administered 2019-06-11: 15:00:00 2 g via INTRAVENOUS

## 2019-06-11 MED ORDER — CEFAZOLIN SODIUM-DEXTROSE 2-4 GM/100ML-% IV SOLN
INTRAVENOUS | Status: AC
  Filled 2019-06-11: qty 100

## 2019-06-11 MED ORDER — FERROUS SULFATE 325 (65 FE) MG PO TABS
325.0000 mg | ORAL_TABLET | Freq: Two times a day (BID) | ORAL | Status: DC
  Administered 2019-06-12 – 2019-06-14 (×5): 325 mg via ORAL
  Filled 2019-06-11 (×5): qty 1

## 2019-06-11 MED ORDER — PROMETHAZINE HCL 25 MG/ML IJ SOLN: 6.2500 mg | INTRAMUSCULAR | Status: DC | PRN

## 2019-06-11 MED ORDER — SODIUM CHLORIDE 0.9% IV SOLUTION: Freq: Once | INTRAVENOUS | Status: DC

## 2019-06-11 MED ORDER — FENTANYL CITRATE (PF) 100 MCG/2ML IJ SOLN
25.0000 ug | INTRAMUSCULAR | Status: DC | PRN
  Administered 2019-06-11: 25 ug via INTRAVENOUS
  Administered 2019-06-11: 50 ug via INTRAVENOUS
  Administered 2019-06-11: 25 ug via INTRAVENOUS

## 2019-06-11 MED ORDER — PHENYLEPHRINE HCL-NACL 20-0.9 MG/250ML-% IV SOLN
INTRAVENOUS | Status: DC | PRN
  Administered 2019-06-11: 60 ug/min via INTRAVENOUS

## 2019-06-11 MED ORDER — DIPHENHYDRAMINE HCL 25 MG PO CAPS: 25.0000 mg | ORAL_CAPSULE | Freq: Four times a day (QID) | ORAL | Status: DC | PRN

## 2019-06-11 MED ORDER — OXYCODONE-ACETAMINOPHEN 5-325 MG PO TABS
2.0000 | ORAL_TABLET | ORAL | Status: DC | PRN
  Administered 2019-06-13 (×4): 2 via ORAL
  Filled 2019-06-11 (×3): qty 2

## 2019-06-11 MED ORDER — LACTATED RINGERS IV SOLN
INTRAVENOUS | Status: DC
  Administered 2019-06-11: 12:00:00 via INTRAVENOUS

## 2019-06-11 MED ORDER — SIMETHICONE 80 MG PO CHEW: 80.0000 mg | CHEWABLE_TABLET | ORAL | Status: DC | PRN

## 2019-06-11 MED ORDER — SIMETHICONE 80 MG PO CHEW
80.0000 mg | CHEWABLE_TABLET | Freq: Three times a day (TID) | ORAL | Status: DC
  Administered 2019-06-12 – 2019-06-14 (×8): 80 mg via ORAL
  Filled 2019-06-11 (×8): qty 1

## 2019-06-11 MED ORDER — BUPIVACAINE HCL (PF) 0.5 % IJ SOLN
INTRAMUSCULAR | Status: AC
  Filled 2019-06-11: qty 30

## 2019-06-11 MED ORDER — PHENYLEPHRINE HCL-NACL 20-0.9 MG/250ML-% IV SOLN
INTRAVENOUS | Status: AC
  Filled 2019-06-11: qty 250

## 2019-06-11 SURGICAL SUPPLY — 40 items
BENZOIN TINCTURE PRP APPL 2/3 (GAUZE/BANDAGES/DRESSINGS) IMPLANT
CHLORAPREP W/TINT 26ML (MISCELLANEOUS) ×3 IMPLANT
CLAMP CORD UMBIL (MISCELLANEOUS) IMPLANT
CLOSURE WOUND 1/2 X4 (GAUZE/BANDAGES/DRESSINGS)
CLOTH BEACON ORANGE TIMEOUT ST (SAFETY) ×3 IMPLANT
DRSG OPSITE POSTOP 4X10 (GAUZE/BANDAGES/DRESSINGS) ×3 IMPLANT
ELECT REM PT RETURN 9FT ADLT (ELECTROSURGICAL) ×3
ELECTRODE REM PT RTRN 9FT ADLT (ELECTROSURGICAL) ×1 IMPLANT
EXTRACTOR VACUUM BELL STYLE (SUCTIONS) IMPLANT
GAUZE SPONGE 4X4 12PLY STRL LF (GAUZE/BANDAGES/DRESSINGS) ×4 IMPLANT
GLOVE BIOGEL PI IND STRL 6.5 (GLOVE) ×1 IMPLANT
GLOVE BIOGEL PI IND STRL 7.0 (GLOVE) ×2 IMPLANT
GLOVE BIOGEL PI INDICATOR 6.5 (GLOVE) ×2
GLOVE BIOGEL PI INDICATOR 7.0 (GLOVE) ×4
GLOVE ORTHOPEDIC STR SZ6.5 (GLOVE) ×3 IMPLANT
GOWN STRL REUS W/TWL LRG LVL3 (GOWN DISPOSABLE) ×9 IMPLANT
HEMOSTAT ARISTA ABSORB 3G PWDR (HEMOSTASIS) ×2 IMPLANT
HEMOSTAT SURGICEL 2X14 (HEMOSTASIS) ×2 IMPLANT
KIT ABG SYR 3ML LUER SLIP (SYRINGE) IMPLANT
NDL HYPO 25X1 1.5 SAFETY (NEEDLE) IMPLANT
NEEDLE HYPO 22GX1.5 SAFETY (NEEDLE) ×3 IMPLANT
NEEDLE HYPO 25X1 1.5 SAFETY (NEEDLE) IMPLANT
NS IRRIG 1000ML POUR BTL (IV SOLUTION) ×3 IMPLANT
PACK C SECTION WH (CUSTOM PROCEDURE TRAY) ×3 IMPLANT
PAD ABD 7.5X8 STRL (GAUZE/BANDAGES/DRESSINGS) ×2 IMPLANT
PAD OB MATERNITY 4.3X12.25 (PERSONAL CARE ITEMS) ×3 IMPLANT
PENCIL SMOKE EVAC W/HOLSTER (ELECTROSURGICAL) ×3 IMPLANT
RETAINER VISCERAL (MISCELLANEOUS) ×2 IMPLANT
STRIP CLOSURE SKIN 1/2X4 (GAUZE/BANDAGES/DRESSINGS) IMPLANT
SUT MON AB 4-0 PS1 27 (SUTURE) ×3 IMPLANT
SUT PDS AB 0 CTX 60 (SUTURE) ×2 IMPLANT
SUT PLAIN 2 0 (SUTURE) ×2
SUT PLAIN ABS 2-0 CT1 27XMFL (SUTURE) ×1 IMPLANT
SUT VIC AB 0 CT1 36 (SUTURE) ×6 IMPLANT
SUT VIC AB 0 CTX 36 (SUTURE) ×2
SUT VIC AB 0 CTX36XBRD ANBCTRL (SUTURE) ×1 IMPLANT
SYR CONTROL 10ML LL (SYRINGE) ×3 IMPLANT
TOWEL OR 17X24 6PK STRL BLUE (TOWEL DISPOSABLE) ×3 IMPLANT
TRAY FOLEY W/BAG SLVR 14FR LF (SET/KITS/TRAYS/PACK) ×3 IMPLANT
WATER STERILE IRR 1000ML POUR (IV SOLUTION) ×3 IMPLANT

## 2019-06-11 NOTE — Transfer of Care (Signed)
Immediate Anesthesia Transfer of Care Note  Patient: Laura Leonard  Procedure(s) Performed: CESAREAN SECTION (N/A Abdomen)  Patient Location: PACU  Anesthesia Type:Spinal  Level of Consciousness: awake, alert  and oriented  Airway & Oxygen Therapy: Patient Spontanous Breathing  Post-op Assessment: Report given to RN and Post -op Vital signs reviewed and stable  Post vital signs: Reviewed and stable  Last Vitals:  Vitals Value Taken Time  BP 140/82 06/11/19 1619  Temp    Pulse 74 06/11/19 1622  Resp 17 06/11/19 1622  SpO2 100 % 06/11/19 1622  Vitals shown include unvalidated device data.  Last Pain:  Vitals:   06/11/19 1145  TempSrc: Oral         Complications: No apparent anesthesia complications

## 2019-06-11 NOTE — Anesthesia Procedure Notes (Signed)
Epidural Patient location during procedure: OR Start time: 06/11/2019 2:48 PM End time: 06/11/2019 2:50 PM  Staffing Anesthesiologist: Brennan Bailey, MD Performed: anesthesiologist   Preanesthetic Checklist Completed: patient identified, pre-op evaluation, timeout performed, IV checked, risks and benefits discussed and monitors and equipment checked  Epidural Patient position: sitting Prep: site prepped and draped and DuraPrep Patient monitoring: heart rate, continuous pulse ox and blood pressure Approach: midline Location: L3-L4 Injection technique: LOR air  Needle:  Needle type: Tuohy  Needle gauge: 17 G Needle length: 9 cm Needle insertion depth: 6 cm Catheter type: closed end flexible Catheter size: 19 Gauge Catheter at skin depth: 12 cm  Assessment Events: blood not aspirated, injection not painful, no injection resistance, negative IV test and no paresthesia  Additional Notes Patient identified. Risks, benefits, and alternatives discussed with patient including but not limited to bleeding, infection, nerve damage, paralysis, failed block, incomplete pain control, headache, blood pressure changes, nausea, vomiting, reactions to medication, itching, and postpartum back pain. Confirmed with bedside nurse the patient's most recent platelet count. Confirmed with patient that they are not currently taking any anticoagulation, have any bleeding history, or any family history of bleeding disorders. Patient expressed understanding and wished to proceed. All questions were answered. Sterile technique was used throughout the entire procedure. Please see nursing notes for vital signs.   Crisp LOR with Tuohy needle on first pass. Whitacre 25g 180mm spinal needle introduced through Tuohy with clear CSF return prior to injection of intrathecal medication. Spinal needle withdrawn and epidural catheter threaded easily. Negative aspiration of catheter for heme or CSF via epidural catheter.  Reason for block:procedure for pain

## 2019-06-11 NOTE — Discharge Summary (Signed)
OB Discharge Summary     Patient Name: Laura Leonard DOB: 06/17/1991 MRN: [id number]  Date of admission: 06/11/2019 Delivering MD: Conan BowensAVIS, KELLY M   Date of discharge: 06/14/2019  Admitting diagnosis: RCS Intrauterine pregnancy: 1148w1d     Secondary diagnosis:  Active Problems:   Status post repeat low transverse cesarean section  Additional problems: Mild Pre-eclampsia, Anemia, Rh negative     Discharge diagnosis: Term Pregnancy Delivered, Preeclampsia (mild) and Anemia                                                                                                Post partum procedures:rhogam  Augmentation: none  Complications: None  Hospital course:  Sceduled C/S   28 y.o. yo [id number] at 848w1d was admitted to the hospital 06/11/2019 for scheduled cesarean section with the following indication:Elective Repeat.  Membrane Rupture Time/Date: 3:31 PM ,06/11/2019   Patient delivered a Viable infant.06/11/2019  Details of operation can be found in separate operative note.  Pateint had an uncomplicated postpartum course.  She is ambulating, tolerating a regular diet, passing flatus, and urinating well. Patient is discharged home in stable condition on  06/14/19         Physical exam  Vitals:   06/13/19 2135 06/13/19 2152 06/14/19 0515 06/14/19 0930  BP: (!) 157/102 (!) 159/92 136/77 134/86  Pulse: 72 70 85 89  Resp: 16  16   Temp:   98.1 F (36.7 C)   TempSrc:   Oral   SpO2:      Weight:      Height:       General: alert, cooperative and no distress Lochia: appropriate Uterine Fundus: firm Incision: Healing well with no significant drainage, No significant erythema, Dressing is clean, dry, and intact DVT Evaluation: No evidence of DVT seen on physical exam. No cords or calf tenderness. No significant calf/ankle edema. Labs: Lab Results  Component Value Date   WBC 12.2 (H) 06/12/2019   HGB 7.0 (L) 06/12/2019   HCT 22.5 (L) 06/12/2019   MCV 75.0 (L) 06/12/2019   PLT  279 06/12/2019   CMP Latest Ref Rng & Units 06/11/2019  Glucose 70 - 99 mg/dL 87  BUN 6 - 20 mg/dL <1(Y<5(L)  Creatinine 7.820.44 - 1.00 mg/dL 9.560.53  Sodium 213135 - 086145 mmol/L 137  Potassium 3.5 - 5.1 mmol/L 3.5  Chloride 98 - 111 mmol/L 106  CO2 22 - 32 mmol/L 20(L)  Calcium 8.9 - 10.3 mg/dL 7.9(L)  Total Protein 6.5 - 8.1 g/dL 6.1(L)  Total Bilirubin 0.3 - 1.2 mg/dL 0.4  Alkaline Phos 38 - 126 U/L 91  AST 15 - 41 U/L 12(L)  ALT 0 - 44 U/L 10    Discharge instruction: per After Visit Summary and "Baby and Me Booklet".  After visit meds:  Allergies as of 06/14/2019   No Known Allergies     Medication List    STOP taking these medications   ferrous fumarate 325 (106 Fe) MG Tabs tablet Commonly known as: HEMOCYTE - 106 mg FE     TAKE these medications   benzocaine  10 % mucosal gel Commonly known as: ORAJEL Use as directed 1 application in the mouth or throat as needed for mouth pain.   ferrous sulfate 325 (65 FE) MG tablet Take 1 tablet (325 mg total) by mouth daily with breakfast.   ibuprofen 800 MG tablet Commonly known as: ADVIL Take 1 tablet (800 mg total) by mouth every 6 (six) hours as needed for moderate pain.   NIFEdipine 30 MG 24 hr tablet Commonly known as: ADALAT CC Take 1 tablet (30 mg total) by mouth daily.   oxyCODONE-acetaminophen 5-325 MG tablet Commonly known as: PERCOCET/ROXICET Take 1-2 tablets by mouth every 4 (four) hours as needed for moderate pain.   prenatal multivitamin Tabs tablet Take 1 tablet by mouth daily at 12 noon.   senna-docusate 8.6-50 MG tablet Commonly known as: Senokot-S Take 2 tablets by mouth daily. Start taking on: June 15, 2019       Diet: routine diet  Activity: Advance as tolerated. Pelvic rest for 6 weeks.   Outpatient follow up:1 week BP check and incision check, 4 week PP visit Follow up Appt: Future Appointments  Date Time Provider [place]  06/26/2019 10:20 AM [facility]  07/11/2019   1:15 PM Aletha Halim, MD [place]  07/23/2019  8:20 AM [facility]   Follow up Visit:No follow-ups on file.  Postpartum contraception: declined  Newborn Data: Live born female  Birth Weight:   APGAR: 76, 8  Newborn Delivery   Birth date/time: 06/11/2019 15:33:00 Delivery type: C-Section, Low Transverse Trial of labor: No C-section categorization: Repeat      Baby Feeding: Bottle and Breast Disposition:home with mother   06/14/2019 Kerry Hough, PA-C

## 2019-06-11 NOTE — MAU Note (Signed)
Covid swab collected. PT tolerated well. PT asymptomatic 

## 2019-06-11 NOTE — OR Nursing (Signed)
Phoned pt at 10:15 because she has no labs or covid done. Pt informed she needs to be at hospital 2 hours before for lab work pt stated she hasnt left house and she will be showering before coming. Pt phoned back at 10:50 stated she still has not left yet and she will not be doing covid test. Instructed where to come to hospital.

## 2019-06-11 NOTE — H&P (Signed)
Obstetric Preoperative History and Physical  Laura Leonard is a 28 y.o. [phone] with IUP at [redacted]w[redacted]d presenting for scheduled cesarean section.  No acute concerns.   Patient has had no prenatal care aside from one visit to the MAU. Labs drawn today. Notable for Rh negative and mild pre-eclampsia. Elevated A1C.  Prenatal Course Source of Care: none Pregnancy complications or risks: Patient Active Problem List   Diagnosis Date Noted  . Gestational hypertension 05/10/2019  . Marijuana abuse 11/22/2017  . History of classical cesarean section 11/22/2017  . Status post cesarean section 11/22/2017  . Low grade squamous intraepithelial lesion (LGSIL) on cervical Pap smear, cannot rule out high grade cells on 04/06/16 04/08/2016  . Rh negative, antepartum 04/08/2016  . Rubella non-immune status, antepartum 04/08/2016  . Late prenatal care affecting pregnancy in third trimester 10/14/2014   She undecided for feeding. She desires no method for postpartum contraception.   Prenatal labs and studies: ABO, Rh: --/--/B NEG (08/17 1159) Antibody: PENDING (08/17 1159) Rubella:   RPR:    HBsAg:    HIV:    GBS:  2 hr Glucola  Not done Genetic screening not done Anatomy US normal at 32 weeks  Prenatal Transfer Tool  Fetal Ultrasounds or other Referrals:  None Maternal Substance Abuse:  No Significant Maternal Medications:  None Significant Maternal Lab Results: Rh negative  Past Medical History:  Diagnosis Date  . Anemia   . Gestational diabetes   . Headache    hx of migraines  . Hypertension    with 2nd pregnancy  . Pregnancy induced hypertension   . Trichomonas contact     Past Surgical History:  Procedure Laterality Date  . CESAREAN SECTION N/A 05/26/2016   Procedure: CESAREAN SECTION;  Surgeon: Jonnie Kind, MD;  Location: [facility];  Service: Obstetrics;  Laterality: N/A;  . CESAREAN SECTION MULTI-GESTATIONAL  11/22/2017   Procedure: CESAREAN SECTION  MULTI-GESTATIONAL;  Surgeon: Sloan Leiter, MD;  Location: [facility];  Service: Obstetrics;;    OB History  Gravida Para Term Preterm AB Living  5 3 2 1 1 4   SAB TAB Ectopic Multiple Live Births  1 0   1 4    # Outcome Date GA Lbr Len/2nd Weight Sex Delivery Anes PTL Lv  5 Current           4A Preterm 11/22/17 [redacted]w[redacted]d  1540 g F CS-LVertical Spinal  LIV  4B Preterm 11/22/17 [redacted]w[redacted]d  1430 g F CS-LVertical Spinal  LIV  3 Term 05/26/16 110w1d  3685 g F CS-LTranv EPI  LIV  2 Term 05/01/08   3430 g F Vag-Spont   LIV  1 SAB             Social History   Socioeconomic History  . Marital status: Single    Spouse name: Not on file  . Number of children: Not on file  . Years of education: Not on file  . Highest education level: Not on file  Occupational History  . Not on file  Social Needs  . Financial resource strain: Not hard at all  . Food insecurity    Worry: Never true    Inability: Never true  . Transportation needs    Medical: No    Non-medical: No  Tobacco Use  . Smoking status: Former Smoker    Packs/day: 0.50    Types: Cigarettes    Quit date: 07/2017    Years since quitting: 1.8  .  Smokeless tobacco: Never Used  Substance and Sexual Activity  . Alcohol use: Yes    Comment: 3 months ago as of 08/03/17  . Drug use: Not Currently    Types: Marijuana    Comment: positive on 11/19/17  . Sexual activity: Yes    Comment: last sex 2-3 wks ago  Lifestyle  . Physical activity    Days per week: Not on file    Minutes per session: Not on file  . Stress: To some extent  Relationships  . Social Musicianconnections    Talks on phone: Not on file    Gets together: Not on file    Attends religious service: Not on file    Active member of club or organization: Not on file    Attends meetings of clubs or organizations: Not on file    Relationship status: Not on file  Other Topics Concern  . Not on file  Social History Narrative  . Not on file    Family History  Problem  Relation Age of Onset  . Diabetes Mother   . Hypertension Mother     Medications Prior to Admission  Medication Sig Dispense Refill Last Dose  . acetaminophen (TYLENOL) 500 MG tablet Take 500 mg by mouth every 6 (six) hours as needed for moderate pain.     Marland Kitchen. amLODipine (NORVASC) 5 MG tablet Take 1 tablet (5 mg total) by mouth daily. 30 tablet 0   . ferrous fumarate (HEMOCYTE - 106 MG FE) 325 (106 Fe) MG TABS tablet Take 1 tablet (106 mg of iron total) by mouth daily. 30 tablet 0     No Known Allergies  Review of Systems: Negative except for what is mentioned in HPI.  Physical Exam: BP (!) 139/93   Pulse (!) 122   Temp 98.5 F (36.9 C) (Oral)   Resp 18   Ht 5\' 4"  (1.626 m)   Wt 108.9 kg   LMP 09/24/2018   SpO2 100%   Breastfeeding Yes   BMI 41.20 kg/m  FHR by Doppler: 140 bpm CONSTITUTIONAL: Well-developed, well-nourished female in no acute distress.  HENT:  Normocephalic, atraumatic, External right and left ear normal. Oropharynx is clear and moist EYES: Conjunctivae and EOM are normal. Pupils are equal, round, and reactive to light. No scleral icterus.  NECK: Normal range of motion, supple, no masses SKIN: Skin is warm and dry. No rash noted. Not diaphoretic. No erythema. No pallor. NEUROLGIC: Alert and oriented to person, place, and time. Normal reflexes, muscle tone coordination. No cranial nerve deficit noted. PSYCHIATRIC: Normal mood and affect. Normal behavior. Normal judgment and thought content. CARDIOVASCULAR: Normal heart rate noted, regular rhythm RESPIRATORY: Effort and breath sounds normal, no problems with respiration noted ABDOMEN: Soft, nontender, nondistended, gravid. Well-healed Pfannenstiel incision. PELVIC: Deferred MUSCULOSKELETAL: Normal range of motion. No edema and no tenderness. 2+ distal pulses.   Pertinent Labs/Studies:   Results for orders placed or performed during the hospital encounter of 06/11/19 (from the past 72 hour(s))  SARS  Coronavirus 2 [facility](Hospital order, Performed in [facility] lab) Nasopharyngeal Nasopharyngeal Swab     Status: None   Collection Time: 06/11/19 11:28 AM   Specimen: Nasopharyngeal Swab  Result Value Ref Range   SARS Coronavirus 2 NEGATIVE NEGATIVE    Comment: (NOTE) If result is NEGATIVE SARS-CoV-2 target nucleic acids are NOT DETECTED. The SARS-CoV-2 RNA is generally detectable in upper and lower  respiratory specimens during the acute phase of infection. The lowest  concentration of  SARS-CoV-2 viral copies this assay can detect is 250  copies / mL. A negative result does not preclude SARS-CoV-2 infection  and should not be used as the sole basis for treatment or other  patient management decisions.  A negative result may occur with  improper specimen collection / handling, submission of specimen other  than nasopharyngeal swab, presence of viral mutation(s) within the  areas targeted by this assay, and inadequate number of viral copies  (<250 copies / mL). A negative result must be combined with clinical  observations, patient history, and epidemiological information. If result is POSITIVE SARS-CoV-2 target nucleic acids are DETECTED. The SARS-CoV-2 RNA is generally detectable in upper and lower  respiratory specimens dur ing the acute phase of infection.  Positive  results are indicative of active infection with SARS-CoV-2.  Clinical  correlation with patient history and other diagnostic information is  necessary to determine patient infection status.  Positive results do  not rule out bacterial infection or co-infection with other viruses. If result is PRESUMPTIVE POSTIVE SARS-CoV-2 nucleic acids MAY BE PRESENT.   A presumptive positive result was obtained on the submitted specimen  and confirmed on repeat testing.  While 2019 novel coronavirus  (SARS-CoV-2) nucleic acids may be present in the submitted sample  additional confirmatory testing may be necessary for  epidemiological  and / or clinical management purposes  to differentiate between  SARS-CoV-2 and other Sarbecovirus currently known to infect humans.  If clinically indicated additional testing with an alternate test  methodology [phone]([id number]) is advised. The SARS-CoV-2 RNA is generally  detectable in upper and lower respiratory sp ecimens during the acute  phase of infection. The expected result is Negative. Fact Sheet for Patients:  BoilerBrush.com.cyhttps://www.fda.gov/media/136312/download Fact Sheet for Healthcare Providers: https://pope.com/https://www.fda.gov/media/136313/download This test is not yet approved or cleared by the [place] FDA and has been authorized for detection and/or diagnosis of SARS-CoV-2 by FDA under an Emergency Use Authorization (EUA).  This EUA will remain in effect (meaning this test can be used) for the duration of the COVID-19 declaration under Section 564(b)(1) of the Act, 21 U.S.C. section 360bbb-3(b)(1), unless the authorization is terminated or revoked sooner. Performed at [facility], [address] [place], [place] [address]   Glucose, capillary     Status: Abnormal   Collection Time: 06/11/19 11:45 AM  Result Value Ref Range   Glucose-Capillary 108 (H) 70 - 99 mg/dL  CBC     Status: Abnormal   Collection Time: 06/11/19 11:50 AM  Result Value Ref Range   WBC 10.8 (H) 4.0 - 10.5 K/uL   RBC 3.65 (L) 3.87 - 5.11 MIL/uL   Hemoglobin 8.5 (L) 12.0 - 15.0 g/dL    Comment: Reticulocyte Hemoglobin testing may be clinically indicated, consider ordering this additional test [id number]    HCT 27.4 (L) 36.0 - 46.0 %   MCV 75.1 (L) 80.0 - 100.0 fL   MCH 23.3 (L) 26.0 - 34.0 pg   MCHC 31.0 30.0 - 36.0 g/dL   RDW 29.515.5 62.111.5 - 30.815.5 %   Platelets 312 150 - 400 K/uL   nRBC 0.0 0.0 - 0.2 %    Comment: Performed at [facility], [address] [place], [place] [address]  Differential     Status: Abnormal   Collection Time: 06/11/19 11:50 AM  Result Value Ref Range    Neutrophils Relative % 73 %   Neutro Abs 8.0 (H) 1.7 - 7.7 K/uL   Lymphocytes Relative 17 %  Lymphs Abs 1.8 0.7 - 4.0 K/uL   Monocytes Relative 9 %   Monocytes Absolute 0.9 0.1 - 1.0 K/uL   Eosinophils Relative 1 %   Eosinophils Absolute 0.1 0.0 - 0.5 K/uL   Basophils Relative 0 %   Basophils Absolute 0.0 0.0 - 0.1 K/uL   Immature Granulocytes 0 %   Abs Immature Granulocytes 0.04 0.00 - 0.07 K/uL    Comment: Performed at [facility], [address] [place], [place] [address]  Hemoglobin A1c     Status: Abnormal   Collection Time: 06/11/19 11:50 AM  Result Value Ref Range   Hgb A1c MFr Bld 6.6 (H) 4.8 - 5.6 %    Comment: (NOTE) Pre diabetes:          5.7%-6.4% Diabetes:              >6.4% Glycemic control for   <7.0% adults with diabetes    Mean Plasma Glucose 142.72 mg/dL    Comment: Performed at [facility], [address] [place], [place] [address]  Type and screen [facility]     Status: None (Preliminary result)   Collection Time: 06/11/19 11:59 AM  Result Value Ref Range   ABO/RH(D) B NEG    Antibody Screen PENDING    Sample Expiration      06/14/2019,2359 Performed at [facility], [address] [place], [place] [address]     Assessment and Plan :Hinda GlatterLavonnta R Leonard is a 28 y.o. [id number] at 5431w1d being admitted for scheduled repeat cesarean section. No acute issues today. Declines BTL.  1. Rh negative - received Rho gam in MAU 4 weeks ago  2. No prenatal care - patient states she doesn't like doctors and this is why she has not gotten care - reviewed importance of obtaining prenatal care, need for assessment of patient and baby, especially concerning her diagnosis of pre-eclampsia, patient verbalizes understanding - UDS - SW consult - A1C elevated  3. Mild pre-eclampsia - normal LFT and Cr 4 weeks ago - CMP pending  The risks of cesarean section were discussed with the patient; including but not limited to: infection  which may require antibiotics; bleeding which may require transfusion or re-operation; injury to bowel, bladder, ureters or other surrounding organs; need for additional procedures including hysterectomy in the event of a life-threatening hemorrhage; placental abnormalities wth subsequent pregnancies, risk of needing c-sections in future pregnancies, incisional problems, thromboembolic phenomenon and other postoperative/anesthesia complications. Answered all questions. The patient verbalized understanding of the plan, giving informed consent for the procedure. She is agreeable to blood transfusion in the event of emergency.  Patient has been NPO since midnight, she will remain NPO for procedure Anesthesia and OR aware Preoperative prophylactic antibiotics and SCDs ordered on call to the OR  To OR when ready   K. Therese SarahMeryl Janeann Paisley, M.D. Attending Obstetrician & Gynecologist, [facility], [facility]

## 2019-06-11 NOTE — Anesthesia Postprocedure Evaluation (Signed)
Anesthesia Post Note  Patient: RAMIYAH MCCLENAHAN  Procedure(s) Performed: CESAREAN SECTION (N/A Abdomen)     Patient location during evaluation: PACU Anesthesia Type: General Level of consciousness: awake and alert and oriented Pain management: pain level controlled Vital Signs Assessment: post-procedure vital signs reviewed and stable Respiratory status: spontaneous breathing, nonlabored ventilation and respiratory function stable Cardiovascular status: blood pressure returned to baseline Postop Assessment: no apparent nausea or vomiting and spinal receding Anesthetic complications: no    Last Vitals:  Vitals:   06/11/19 1715 06/11/19 1725  BP: (!) 150/88 (!) 146/87  Pulse: 80 78  Resp: 18 18  Temp:  36.8 C  SpO2: 98% 99%    Last Pain:  Vitals:   06/11/19 1725  TempSrc: Oral  PainSc:    Pain Goal:    LLE Motor Response: Purposeful movement (06/11/19 1715) LLE Sensation: Tingling (06/11/19 1715) RLE Motor Response: Purposeful movement (06/11/19 1715) RLE Sensation: Tingling (06/11/19 1715)     Epidural/Spinal Function Cutaneous sensation: Tingles (06/11/19 1715), Patient able to flex knees: Yes (06/11/19 1715), Patient able to lift hips off bed: No (06/11/19 1715), Back pain beyond tenderness at insertion site: No (06/11/19 1715), Progressively worsening motor and/or sensory loss: No (06/11/19 1715), Bowel and/or bladder incontinence post epidural: No (06/11/19 1715)  Brennan Bailey

## 2019-06-11 NOTE — Lactation Note (Signed)
This note was copied from a baby's chart. Lactation Consultation Note  Patient Name: Laura Leonard HMCNO'B Date: 06/11/2019   Spoke to [employer] and she confirmed to [facility] that mom is just going to do formula, she won't be doing breast/bottle or pumping and bottle feeding like stated in her earlier feeding choice. [place] services no longer needed.  Maternal Data    Feeding Feeding Type: Bottle Fed - Formula Nipple Type: Extra Slow Flow  LATCH Score                   Interventions    Lactation Tools Discussed/Used     Consult Status      Laura Leonard 06/11/2019, 5:45 PM

## 2019-06-12 ENCOUNTER — Telehealth: Payer: Self-pay | Admitting: Medical

## 2019-06-12 DIAGNOSIS — O34211 Maternal care for low transverse scar from previous cesarean delivery: Secondary | ICD-10-CM

## 2019-06-12 DIAGNOSIS — O1404 Mild to moderate pre-eclampsia, complicating childbirth: Secondary | ICD-10-CM

## 2019-06-12 DIAGNOSIS — O360130 Maternal care for anti-D [Rh] antibodies, third trimester, not applicable or unspecified: Secondary | ICD-10-CM

## 2019-06-12 DIAGNOSIS — Z3A37 37 weeks gestation of pregnancy: Secondary | ICD-10-CM

## 2019-06-12 LAB — HIV ANTIBODY (ROUTINE TESTING W REFLEX): HIV Screen 4th Generation wRfx: NONREACTIVE

## 2019-06-12 LAB — CBC
HCT: 22.5 % — ABNORMAL LOW (ref 36.0–46.0)
Hemoglobin: 7 g/dL — ABNORMAL LOW (ref 12.0–15.0)
MCH: 23.3 pg — ABNORMAL LOW (ref 26.0–34.0)
MCHC: 31.1 g/dL (ref 30.0–36.0)
MCV: 75 fL — ABNORMAL LOW (ref 80.0–100.0)
Platelets: 279 10*3/uL (ref 150–400)
RBC: 3 MIL/uL — ABNORMAL LOW (ref 3.87–5.11)
RDW: 15.5 % (ref 11.5–15.5)
WBC: 12.2 10*3/uL — ABNORMAL HIGH (ref 4.0–10.5)
nRBC: 0 % (ref 0.0–0.2)

## 2019-06-12 LAB — RUBELLA SCREEN: Rubella: 1.02 index (ref >0.99)

## 2019-06-12 LAB — RPR: RPR Ser Ql: NONREACTIVE

## 2019-06-12 LAB — HEPATITIS B SURFACE ANTIGEN: Hepatitis B Surface Ag: NEGATIVE

## 2019-06-12 NOTE — Progress Notes (Signed)
Rn verified at 0300 am that mom is hep b neg per [employer] [facility]. Our lab has just resulted as negative.

## 2019-06-12 NOTE — Clinical Social Work Maternal (Signed)
CLINICAL SOCIAL WORK MATERNAL/CHILD NOTE  Patient Details  Name: Laura Leonard MRN: [id number] Date of Birth: 09/21/1991  Date:  24-Oct-2019  Clinical Social Worker Initiating Note:  Elijio Miles Date/Time: Initiated:  06/12/19/1026     Child's Name:  Laura Leonard   Biological Parents:  Mother, Father(Laura Leonard and Laura Leonard DOB: 10/19/1988)   Need for Interpreter:  None   Reason for Referral:  Late or No Prenatal Care , Current Substance Use/Substance Use During Pregnancy    Address:  [address] [place] [address]    Phone number:  [phone] (home)     Additional phone number:   Household Members/Support Persons (HM/SP):   Household Member/Support Person 1, Household Member/Support Person 2, Household Member/Support Person 3, Household Member/Support Person 4   HM/SP Name Relationship DOB or Age  HM/SP -1 Laura Leonard Daughter 05/26/2016  HM/SP -2 Laura Leonard Daughter 11/22/2017  HM/SP -3 Laura Leonard Daughter 11/22/2017  HM/SP -Laura Leonard Daughter (currently staying with her dad - Laura Leonard) 05/01/2008  HM/SP -5        HM/SP -6        HM/SP -7        HM/SP -8          Natural Supports (not living in the home):  Parent, Other (Comment)(FOB)   Professional Supports: None   Employment: Unemployed   Type of Work:     Education:  Programmer, systems   Homebound arranged:    Museum/gallery curator Resources:      Other Resources:  Physicist, medical (Intends to apply [facility])   Cultural/Religious Considerations Which May Impact Care:    Strengths:  Ability to meet basic needs , Home prepared for child , Pediatrician chosen   Psychotropic Medications:         Pediatrician:    [place]  Pediatrician List:   [facility] for [place]      Pediatrician Fax Number:    Risk Factors/Current Problems:  Substance  Use    Cognitive State:  Able to Concentrate , Alert , Linear Thinking    Mood/Affect:  Calm , Comfortable , Interested , Relaxed    CSW Assessment: CSW received consult for hx of Anxiety and Depression.  CSW met with MOB to offer support and complete assessment.    MOB resting in bed with infant in basinet, when CSW entered the room. CSW introduced self and explained reason for consult to which MOB expressed understanding and stated "she's been through this before". MOB appropriate and engaged throughout assessment and seemed to anticipate CSW's questions. MOB reported she currently lives with her 3 youngest children and that her oldest child is currently staying with her dad. MOB stated she still maintains custody of child but that prior to [place] she went to stay with her dad and they didn't want to have to go back and forth with the virus. MOB reported she is not currently working but has confirmed she receives food stamps and intends to apply for [facility]. CSW inquired about MOB's mental health history and MOB denied having any. When asked about PPD with her other children, MOB acknowledged having some minor PPD with her second child but did not feel it was unmanageable. CSW provided education regarding the baby blues period vs. perinatal mood disorders, discussed treatment and gave resources for mental health  follow up if concerns arise.  CSW recommends self-evaluation during the postpartum time period using the New Mom Checklist from Postpartum Progress and encouraged MOB to contact a medical professional if symptoms are noted at any time. MOB did not appear to be displaying any acute mental health symptoms. MOB denied any current SI, HI or DV and reported having support from FOB and her family.   CSW inquired about reasoning for why MOB did not receive prenatal care and MOB shared that she was not sure if she was going to keep infant or not and with COVID it had postponed her options. MOB stated  when she found out she was having a boy she changed her mind and felt it was already too late. MOB denied any barriers to getting infant to follow up appointments. CSW also inquired about MOB's substance use history and MOB disclosed using marijuana as she was living in [place] and it is recreational there. MOB stated it also helped with her pregnancy symptoms. CSW informed MOB of Hospital Drug Policy and explained UDS was negative but that CDS was still pending and that a CPS report would be made, if warranted. MOB stated she had CPS involvement with her twins and 42-year-old as the had tested positive for THC. MOB denied any questions or concerns regarding report or policy.  MOB confirmed having all essential items for infant once discharged and stated infant would be sleeping in a basinet once home. CSW provided review of Sudden Infant Death Syndrome (SIDS) precautions and safe sleeping habits.  CSW will continue to monitor CDS and make CPS report, if necessary.    CSW Plan/Description:  No Further Intervention Required/No Barriers to Discharge, Sudden Infant Death Syndrome (SIDS) Education, Perinatal Mood and Anxiety Disorder (PMADs) Education, [place], CSW Will Continue to Monitor Umbilical Cord Tissue Drug Screen Results and Make Report if Foye Spurling, [place] 20-Apr-2019, 11:27 AM

## 2019-06-12 NOTE — Op Note (Signed)
Laura GlatterLavonnta R Arista PROCEDURE DATE: 06/12/2019  PREOPERATIVE DIAGNOSES: Intrauterine pregnancy at 2655w1d weeks gestation; history of c-section x2, history of classical incision  POSTOPERATIVE DIAGNOSES: The same  PROCEDURE: Repeat Low Transverse Cesarean Section  SURGEON:  Baldemar LenisK. Meryl Virginia Francisco, MD  ASSISTANT:  Gildardo CrankerHaley Sparacino, DO  ANESTHESIOLOGY TEAM: Anesthesiologist: Kaylyn LayerHowze, Kathryn E, MD CRNA: Lenox AhrAdeloye, David A, CRNA; Renford DillsMullins, Janet L, CRNA  INDICATIONS: Laura Leonard is a 28 y.o. [phone] at 5455w1d here for cesarean section secondary to the indications listed under preoperative diagnoses; please see preoperative note for further details.  The risks of cesarean section were discussed with the patient including but were not limited to: bleeding which may require transfusion or reoperation; infection which may require antibiotics; injury to bowel, bladder, ureters or other surrounding organs; injury to the fetus; need for additional procedures including hysterectomy in the event of a life-threatening hemorrhage; placental abnormalities wth subsequent pregnancies, incisional problems, thromboembolic phenomenon and other postoperative/anesthesia complications.  She consents to blood transfusion in the event of an emergency. The patient verbalized understanding of the plan, giving informed written consent for the procedure.    FINDINGS:  Viable female infant in cephalic presentation.  Apgars 8 and 8.  Clear amniotic fluid.  Intact placenta, three vessel cord.  Normal uterus, fallopian tubes and ovaries bilaterally.  ANESTHESIA: Spinal INTRAVENOUS FLUIDS: 2650 ml   ESTIMATED BLOOD LOSS: 205 ml URINE OUTPUT:  50 ml SPECIMENS: Placenta sent to L&D COMPLICATIONS: None immediate  PROCEDURE IN DETAIL:  The patient preoperatively received intravenous antibiotics and had sequential compression devices applied to her lower extremities.  She was then taken to the operating room where spinal anesthesia was  administered and was found to be adequate. She was then placed in a dorsal supine position with a leftward tilt. She was prepped and draped in a sterile manner.  A foley catheter was placed into her bladder with sterile technique and attached to constant gravity.  After a timeout was performed, a Pfannenstiel skin incision was made with scalpel over her preexisting scar and carried through to the underlying layer of fascia. The fascia was incised in the midline, and this incision was extended bilaterally using the Mayo scissors.  Kocher clamps were applied to the superior aspect of the fascial incision and the underlying rectus muscles were dissected off sharply.  A similar process was carried out on the inferior aspect of the fascial incision. The rectus muscles were separated in the midline bluntly and the peritoneum was entered sharply. The peritoneal incision was carefully extended sharply laterally and caudad with good visualization of the bladder. The rectus muscle had to be transected laterally with cautery to provide room for delivery, care taken to ensure no bleeding noted at muscle. The uterus appeared normal. The Alexis O-ring retractor was placed into the incision, taking care not to incorporate bowel or omentum. The bladder blade was inserted. Attention was turned to the lower uterine segment where a low transverse hysterotomy was made with a scalpel and extended bilaterally bluntly. The hysterotomy was extended cephalad at the left aspect with the bandage scissors when pressure was not adequate the delivery the infant's head. After enlargement of the hysterotomy, the infant was delivered from LOA position, nose and mouth were bulb suctioned, and the cord clamped and cut after 1 minute. The infant was then handed over to the waiting neonatology team. Uterine massage was then performed, and the placenta delivered intact with a three-vessel cord. The uterus was then cleared of clots and debris.  The  hysterotomy was closed with 0 Vicryl in a running locked fashion, and an imbricating layer was also placed with 0 Vicryl. The fallopian tubes and ovaries were visualized bilaterally and normal appearing. Surgicel was placed over the hysterotomy for small amount of oozing to assist with hemostasis. The Alexis retractor was removed.  The pelvis was cleared of all clot and debris. Arista was placed over the hysterotomy and rectus muscles for additional hemostasis.  Hemostasis was again confirmed on all surfaces. The fascia was then closed using 0 PDS in a running fashion.  The subcutaneous layer was irrigated, then reapproximated with 2-0 plain gut, and 30 ml of 0.5% Marcaine was injected subcutaneously around the incision.  The skin was closed with a 4-0 Monocryl subcuticular stitch. The patient tolerated the procedure well. Sponge, lap, instrument and needle counts were correct x 3.  She was taken to the recovery room in stable condition.    Feliz Beam, M.D. Attending [place], [facility] for [employer], [place]

## 2019-06-12 NOTE — Progress Notes (Addendum)
POSTPARTUM PROGRESS NOTE  POD #1  Subjective:  Laura Leonard is a 28 y.o. [id number] s/p rLTCS at [redacted]w[redacted]d.  She reports she doing well. No acute events overnight.  She denies any problems with ambulating, voiding or po intake. Denies nausea or vomiting. She has  passed flatus. Pain is moderately controlled.  Lochia is appropriate.  Objective: Blood pressure 131/77, pulse 68, temperature 98.2 F (36.8 C), temperature source Oral, resp. rate 18, height 5\' 4"  (1.626 m), weight 108.9 kg, last menstrual period 09/24/2018, SpO2 99 %, currently breastfeeding.  Physical Exam:  General: alert, cooperative and no distress Chest: no respiratory distress Heart:regular rate, distal pulses intact Abdomen: soft, nontender,  Uterine Fundus: firm, appropriately tender DVT Evaluation: No calf swelling or tenderness Extremities: No LE edema Skin: warm, dry; incision clean/dry/intact w/ pressure dressing in place  Recent Labs    06/11/19 1150 06/12/19 0538  HGB 8.5* 7.0*  HCT 27.4* 22.5*    Assessment/Plan: Laura Leonard is a 28 y.o. [id number] s/p rLTCS at [redacted]w[redacted]d for history of CSx2.  POD#1 - Doing welll; pain well controlled. H/H appropriate  Routine postpartum care  OOB, ambulated  Lovenox for VTE prophylaxis Acute Blood Loss Anemia in Post-Op Patient: asymptomatic (HgB 8.5>7.0)   Start po ferrous sulfate BID. IV already out so elected not to order Feraheme.  Contraception: Declines.  Feeding: Breast and bottle.   Dispo: Plan for discharge POD#2 or #3.   LOS: 1 day   Phill Myron, D.O. OB Fellow  06/12/2019, 8:50 AM

## 2019-06-12 NOTE — Plan of Care (Signed)
  Problem: Education: Goal: Knowledge of General Education information will improve Description: Including pain rating scale, medication(s)/side effects and non-pharmacologic comfort measures Outcome: Completed/Met

## 2019-06-13 MED ORDER — NIFEDIPINE ER OSMOTIC RELEASE 30 MG PO TB24
30.0000 mg | ORAL_TABLET | Freq: Every day | ORAL | Status: DC
  Administered 2019-06-13: 30 mg via ORAL
  Filled 2019-06-13: qty 1

## 2019-06-13 NOTE — Progress Notes (Signed)
Subjective: Postpartum Day 2: Cesarean Delivery Patient reports no acute events overnight. Pt reports soreness that is more manageable today. She is tolerating PO, + flatus and no problems voiding. Pt has been able to ambulate without any problems.  Objective: Vital signs in last 24 hours: Temp:  [98.4 F (36.9 C)-98.7 F (37.1 C)] 98.4 F (36.9 C) (08/19 0500) Pulse Rate:  [66-80] 71 (08/19 0500) Resp:  [18] 18 (08/19 0500) BP: (140-157)/(80-97) 140/80 (08/19 0500) SpO2:  [99 %-100 %] 99 % (08/19 0500)  Physical Exam:  General: alert, cooperative and no distress Lochia: appropriate Uterine Fundus: firm, appropriately tender Incision: healing well, no significant drainage, no significant erythema DVT Evaluation: No evidence of DVT seen on physical exam. Negative Homan's sign. No cords or calf tenderness. No significant calf/ankle edema.  Recent Labs    06/11/19 1150 06/12/19 0538  HGB 8.5* 7.0*  HCT 27.4* 22.5*    Assessment/Plan: Status post Cesarean section. Doing well postoperatively.  Continue current care.  Annamary Carolin 06/13/2019, 7:48 AM

## 2019-06-13 NOTE — Progress Notes (Signed)
Notified MD (Chelsey Fair)  of elevated blood pressure. No new orders. To recheck blood pressure in 15 minutes and reevaluate. Patient states she is not experiencing any symptoms related to blood pressure. Will continue to monitor.

## 2019-06-13 NOTE — Progress Notes (Signed)
Notified MD Hansel Feinstein) of blood pressure; below protocol guidelines- no new orders. To recheck in 15 minutes. Patient states she is not experiencing any symptoms and "feels fine". Will continue to monitor.

## 2019-06-13 NOTE — Progress Notes (Signed)
Midwife notified Laura Leonard) of elevated blood pressure; still below guideline for protocol. Midwife stated she would order medication by mouth. Patient denies symptoms related to blood pressure. Awaiting orders, will continue to monitor.

## 2019-06-13 NOTE — Progress Notes (Signed)
Subjective: Postpartum Day 2: Cesarean Delivery Patient reports incisional pain, tolerating PO, + flatus and no problems voiding.    Objective: Vital signs in last 24 hours: Temp:  [98.4 F (36.9 C)-98.7 F (37.1 C)] 98.4 F (36.9 C) (08/19 0500) Pulse Rate:  [66-80] 71 (08/19 0500) Resp:  [18] 18 (08/19 0500) BP: (140-157)/(80-97) 140/80 (08/19 0500) SpO2:  [99 %-100 %] 99 % (08/19 0500)  Physical Exam:  General: alert, cooperative and no distress Lochia: appropriate Uterine Fundus: firm Incision: healing well, no significant drainage DVT Evaluation: No evidence of DVT seen on physical exam.  Recent Labs    06/11/19 1150 06/12/19 0538  HGB 8.5* 7.0*  HCT 27.4* 22.5*    Assessment/Plan: Status post Cesarean section. Doing well postoperatively.  Continue current care Discharge home tomorrow.  Hansel Feinstein 06/13/2019, 8:03 AM

## 2019-06-13 NOTE — Progress Notes (Signed)
Patient ID: Laura Leonard, female   DOB: 11-Oct-1991, 28 y.o.   MRN: [id number] Running some elevated BPs but not quite high enough for protocol  Vitals:   06/13/19 0500 06/13/19 2100 06/13/19 2135 06/13/19 2152  BP: 140/80 (!) 169/103 (!) 157/102 (!) 159/92  Pulse: 71 69 72 70  Resp: 18 16 16    Temp: 98.4 F (36.9 C) 98.6 F (37 C)    TempSrc:  Oral    SpO2: 99%     Weight:      Height:       Consult Dr Ilda Basset WIll start Procardia XL 30mg  QD

## 2019-06-14 MED ORDER — NIFEDIPINE ER 30 MG PO TB24: 30.0000 mg | ORAL_TABLET | Freq: Every day | ORAL | 1 refills | Status: AC

## 2019-06-14 MED ORDER — IBUPROFEN 800 MG PO TABS: 800.0000 mg | ORAL_TABLET | Freq: Four times a day (QID) | ORAL | 0 refills | Status: AC | PRN

## 2019-06-14 MED ORDER — FERROUS SULFATE 325 (65 FE) MG PO TABS: 325.0000 mg | ORAL_TABLET | Freq: Every day | ORAL | 1 refills | Status: AC

## 2019-06-14 MED ORDER — SENNOSIDES-DOCUSATE SODIUM 8.6-50 MG PO TABS: 2.0000 | ORAL_TABLET | ORAL | 1 refills | Status: AC

## 2019-06-14 MED ORDER — OXYCODONE-ACETAMINOPHEN 5-325 MG PO TABS: 1.0000 | ORAL_TABLET | ORAL | 0 refills | Status: AC | PRN

## 2019-06-14 MED FILL — IBUPROFEN 800 MG TAB: 800 | 5 days supply | Qty: 30 | Fill #0

## 2019-06-14 MED FILL — NIFEdipine ER 30 MG TB24: 30 | 30 days supply | Qty: 30 | Fill #0

## 2019-06-14 MED FILL — OXYCODONE W/APAP 5/325 TAB: 5-325 | 3 days supply | Qty: 30 | Fill #0

## 2019-06-14 MED FILL — SENNA S 8.6-50 MG TABS: 8.6-50 | 15 days supply | Qty: 30 | Fill #0

## 2019-06-14 MED FILL — FERROUS SULFATE 325 MG TAB: 325 (65 FE) | 30 days supply | Qty: 30 | Fill #0

## 2019-06-14 NOTE — Discharge Instructions (Signed)
Postpartum Care After Cesarean Delivery °This sheet gives you information about how to care for yourself from the time you deliver your baby to up to 6-12 weeks after delivery (postpartum period). Your health care provider may also give you more specific instructions. If you have problems or questions, contact your health care provider. °Follow these instructions at home: °Medicines °· Take over-the-counter and prescription medicines only as told by your health care provider. °· If you were prescribed an antibiotic medicine, take it as told by your health care provider. Do not stop taking the antibiotic even if you start to feel better. °· Ask your health care provider if the medicine prescribed to you: °? Requires you to avoid driving or using heavy machinery. °? Can cause constipation. You may need to take actions to prevent or treat constipation, such as: °§ Drink enough fluid to keep your urine pale yellow. °§ Take over-the-counter or prescription medicines. °§ Eat foods that are high in fiber, such as beans, whole grains, and fresh fruits and vegetables. °§ Limit foods that are high in fat and processed sugars, such as fried or sweet foods. °Activity °· Gradually return to your normal activities as told by your health care provider. °· Avoid activities that take a lot of effort and energy (are strenuous) until approved by your health care provider. Walking at a slow to moderate pace is usually safe. Ask your health care provider what activities are safe for you. °? Do not lift anything that is heavier than your baby or 10 lb (4.5 kg) as told by your health care provider. °? Do not vacuum, climb stairs, or drive a car for as long as told by your health care provider. °· If possible, have someone help you at home until you are able to do your usual activities yourself. °· Rest as much as possible. Try to rest or take naps while your baby is sleeping. °Vaginal bleeding °· It is normal to have vaginal bleeding  (lochia) after delivery. Wear a sanitary pad to absorb vaginal bleeding and discharge. °? During the first week after delivery, the amount and appearance of lochia is often similar to a menstrual period. °? Over the next few weeks, it will gradually decrease to a dry, yellow-brown discharge. °? For most women, lochia stops completely by 4-6 weeks after delivery. Vaginal bleeding can vary from woman to woman. °· Change your sanitary pads frequently. Watch for any changes in your flow, such as: °? A sudden increase in volume. °? A change in color. °? Large blood clots. °· If you pass a blood clot, save it and call your health care provider to discuss. Do not flush blood clots down the toilet before you get instructions from your health care provider. °· Do not use tampons or douches until your health care provider says this is safe. °· If you are not breastfeeding, your period should return 6-8 weeks after delivery. If you are breastfeeding, your period may return anytime between 8 weeks after delivery and the time that you stop breastfeeding. °Perineal care ° °· If your C-section (Cesarean section) was unplanned, and you were allowed to labor and push before delivery, you may have pain, swelling, and discomfort of the tissue between your vaginal opening and your anus (perineum). You may also have an incision in the tissue (episiotomy) or the tissue may have torn during delivery. Follow these instructions as told by your health care provider: °? Keep your perineum clean and dry as told by   your health care provider. Use medicated pads and pain-relieving sprays and creams as directed. °? If you have an episiotomy or vaginal tear, check the area every day for signs of infection. Check for: °§ Redness, swelling, or pain. °§ Fluid or blood. °§ Warmth. °§ Pus or a bad smell. °? You may be given a squirt bottle to use instead of wiping to clean the perineum area after you go to the bathroom. As you start healing, you may use  the squirt bottle before wiping yourself. Make sure to wipe gently. °? To relieve pain caused by an episiotomy, vaginal tear, or hemorrhoids, try taking a warm sitz bath 2-3 times a day. A sitz bath is a warm water bath that is taken while you are sitting down. The water should only come up to your hips and should cover your buttocks. °Breast care °· Within the first few days after delivery, your breasts may feel heavy, full, and uncomfortable (breast engorgement). You may also have milk leaking from your breasts. Your health care provider can suggest ways to help relieve breast discomfort. Breast engorgement should go away within a few days. °· If you are breastfeeding: °? Wear a bra that supports your breasts and fits you well. °? Keep your nipples clean and dry. Apply creams and ointments as told by your health care provider. °? You may need to use breast pads to absorb milk leakage. °? You may have uterine contractions every time you breastfeed for several weeks after delivery. Uterine contractions help your uterus return to its normal size. °? If you have any problems with breastfeeding, work with your health care provider or a lactation consultant. °· If you are not breastfeeding: °? Avoid touching your breasts as this can make your breasts produce more milk. °? Wear a well-fitting bra and use cold packs to help with swelling. °? Do not squeeze out (express) milk. This causes you to make more milk. °Intimacy and sexuality °· Ask your health care provider when you can engage in sexual activity. This may depend on your: °? Risk of infection. °? Healing rate. °? Comfort and desire to engage in sexual activity. °· You are able to get pregnant after delivery, even if you have not had your period. If desired, talk with your health care provider about methods of family planning or birth control (contraception). °Lifestyle °· Do not use any products that contain nicotine or tobacco, such as cigarettes, e-cigarettes,  and chewing tobacco. If you need help quitting, ask your health care provider. °· Do not drink alcohol, especially if you are breastfeeding. °Eating and drinking ° °· Drink enough fluid to keep your urine pale yellow. °· Eat high-fiber foods every day. These may help prevent or relieve constipation. High-fiber foods include: °? Whole grain cereals and breads. °? Brown rice. °? Beans. °? Fresh fruits and vegetables. °· Take your prenatal vitamins until your postpartum checkup or until your health care provider tells you it is okay to stop. °General instructions °· Keep all follow-up visits for you and your baby as told by your health care provider. Most women visit their health care provider for a postpartum checkup within the first 3-6 weeks after delivery. °Contact a health care provider if you: °· Feel unable to cope with the changes that a new baby brings to your life, and these feelings do not go away. °· Feel unusually sad or worried. °· Have breasts that are painful, hard, or turn red. °· Have a fever. °·   Have trouble holding urine or keeping urine from leaking. °· Have little or no interest in activities you used to enjoy. °· Have not breastfed at all and you have not had a menstrual period for 12 weeks after delivery. °· Have stopped breastfeeding and you have not had a menstrual period for 12 weeks after you stopped breastfeeding. °· Have questions about caring for yourself or your baby. °· Pass a blood clot from your vagina. °Get help right away if you: °· Have chest pain. °· Have difficulty breathing. °· Have sudden, severe leg pain. °· Have severe pain or cramping in your abdomen. °· Bleed from your vagina so much that you fill more than one sanitary pad in one hour. Bleeding should not be heavier than your heaviest period. °· Develop a severe headache. °· Faint. °· Have blurred vision or spots in your vision. °· Have a bad-smelling vaginal discharge. °· Have thoughts about hurting yourself or your  baby. °If you ever feel like you may hurt yourself or others, or have thoughts about taking your own life, get help right away. You can go to your nearest emergency department or call: °· Your local emergency services (911 in the [place] °· A suicide crisis helpline, such as the National Suicide Prevention Lifeline at 1-800-273-8255. This is open 24 hours a day. °Summary °· The period of time from when you deliver your baby to up to 6-12 weeks after delivery is called the postpartum period. °· Gradually return to your normal activities as told by your health care provider. °· Keep all follow-up visits for you and your baby as told by your health care provider. °This information is not intended to replace advice given to you by your health care provider. Make sure you discuss any questions you have with your health care provider. °Document Released: 10/08/2000 Document Revised: 05/31/2018 Document Reviewed: 05/31/2018 °Elsevier Patient Education © 2020 Elsevier Inc. ° °

## 2019-06-15 LAB — TYPE AND SCREEN
ABO/RH(D): B NEG
Antibody Screen: POSITIVE
Unit division: 0
Unit division: 0

## 2019-06-15 LAB — BPAM RBC
Blood Product Expiration Date: 202009182359
Blood Product Expiration Date: 202009182359
Unit Type and Rh: 1700
Unit Type and Rh: 1700

## 2019-06-18 ENCOUNTER — Encounter (HOSPITAL_COMMUNITY): Payer: Self-pay | Admitting: Emergency Medicine

## 2019-06-18 ENCOUNTER — Other Ambulatory Visit: Payer: Self-pay

## 2019-06-18 ENCOUNTER — Emergency Department (HOSPITAL_COMMUNITY)
Admission: EM | Admit: 2019-06-18 | Discharge: 2019-06-18 | Disposition: A | Discharge status: Home / Self Care | Payer: Medicaid Other | Attending: Emergency Medicine | Admitting: Emergency Medicine

## 2019-06-18 DIAGNOSIS — R51 Headache: Secondary | ICD-10-CM | POA: Diagnosis not present

## 2019-06-18 DIAGNOSIS — Z5321 Procedure and treatment not carried out due to patient leaving prior to being seen by health care provider: Secondary | ICD-10-CM | POA: Insufficient documentation

## 2019-06-18 LAB — CBC WITH DIFFERENTIAL/PLATELET
Abs Immature Granulocytes: 0.06 10*3/uL (ref 0.00–0.07)
Basophils Absolute: 0 10*3/uL (ref 0.0–0.1)
Basophils Relative: 0 %
Eosinophils Absolute: 0.2 10*3/uL (ref 0.0–0.5)
Eosinophils Relative: 2 %
HCT: 29.8 % — ABNORMAL LOW (ref 36.0–46.0)
Hemoglobin: 8.8 g/dL — ABNORMAL LOW (ref 12.0–15.0)
Immature Granulocytes: 1 %
Lymphocytes Relative: 29 %
Lymphs Abs: 2.2 10*3/uL (ref 0.7–4.0)
MCH: 23.1 pg — ABNORMAL LOW (ref 26.0–34.0)
MCHC: 29.5 g/dL — ABNORMAL LOW (ref 30.0–36.0)
MCV: 78.2 fL — ABNORMAL LOW (ref 80.0–100.0)
Monocytes Absolute: 0.7 10*3/uL (ref 0.1–1.0)
Monocytes Relative: 9 %
Neutro Abs: 4.4 10*3/uL (ref 1.7–7.7)
Neutrophils Relative %: 59 %
Platelets: 503 10*3/uL — ABNORMAL HIGH (ref 150–400)
RBC: 3.81 MIL/uL — ABNORMAL LOW (ref 3.87–5.11)
RDW: 18.1 % — ABNORMAL HIGH (ref 11.5–15.5)
WBC: 7.6 10*3/uL (ref 4.0–10.5)
nRBC: 0 % (ref 0.0–0.2)

## 2019-06-18 LAB — BASIC METABOLIC PANEL
Anion gap: 11 (ref 5–15)
BUN: 12 mg/dL (ref 6–20)
CO2: 23 mmol/L (ref 22–32)
Calcium: 8.6 mg/dL — ABNORMAL LOW (ref 8.9–10.3)
Chloride: 105 mmol/L (ref 98–111)
Creatinine, Ser: 0.79 mg/dL (ref 0.44–1.00)
GFR calc Af Amer: >60 mL/min (ref >60)
GFR calc non Af Amer: >60 mL/min (ref >60)
Glucose, Bld: 144 mg/dL — ABNORMAL HIGH (ref 70–99)
Potassium: 3.3 mmol/L — ABNORMAL LOW (ref 3.5–5.1)
Sodium: 139 mmol/L (ref 135–145)

## 2019-06-18 NOTE — ED Notes (Signed)
Pt noted to not be outside when called for vitals or room

## 2019-06-18 NOTE — ED Notes (Signed)
Pt called for room no answer

## 2019-06-18 NOTE — ED Notes (Signed)
Pt did not answer when called for vitals update

## 2019-06-18 NOTE — ED Triage Notes (Signed)
Patient reports frontal headache onset Thursday , denies head injury , no emesis or fever .

## 2019-06-19 ENCOUNTER — Encounter: Payer: Self-pay | Admitting: Obstetrics and Gynecology

## 2019-06-25 ENCOUNTER — Telehealth: Payer: Self-pay | Admitting: Family Medicine

## 2019-06-25 NOTE — Telephone Encounter (Signed)
Attempted to call patient about her appointment on 9/1 @ 10:20. No answer and could not leave a voicemail because it was not set-up.

## 2019-06-26 ENCOUNTER — Ambulatory Visit: Payer: Self-pay

## 2019-07-04 NOTE — Telephone Encounter (Signed)
Opened in error

## 2019-07-11 ENCOUNTER — Ambulatory Visit: Payer: Self-pay | Admitting: Obstetrics and Gynecology

## 2019-07-11 ENCOUNTER — Telehealth: Payer: Self-pay | Admitting: Obstetrics and Gynecology

## 2019-07-11 ENCOUNTER — Encounter: Payer: Self-pay | Admitting: Obstetrics and Gynecology

## 2019-07-11 NOTE — Telephone Encounter (Signed)
Attempted to call patient about her missed appointment on 9/16. No answer and could not leave a voicemail because it was not setup. No show letter mailed

## 2019-07-12 NOTE — Progress Notes (Signed)
Patient did not keep her postpartum and colpo appointment for 07/11/2019. Will have front desk send certified letter.  Durene Romans MD Attending [facility] for [employer] farm manager)

## 2019-07-18 ENCOUNTER — Other Ambulatory Visit: Payer: Self-pay | Admitting: *Deleted

## 2019-07-18 DIAGNOSIS — Z8632 Personal history of gestational diabetes: Secondary | ICD-10-CM

## 2019-07-20 ENCOUNTER — Telehealth: Payer: Self-pay | Admitting: Family Medicine

## 2019-07-20 NOTE — Telephone Encounter (Signed)
Attempted to contact patient about her appointment on 9/28 @ 8:20. No answer and unable to leave a message due to the voicemail not being set up.

## 2019-07-23 ENCOUNTER — Other Ambulatory Visit: Payer: Self-pay

## 2020-03-03 IMAGING — US US MFM OB LIMITED
1 series · 15 of 24 positions shown · non-contrast
Comparison: none

[Series 1: us mfm ob limited · 24 acquisitions, 15 frames shown]
[im 1/24]
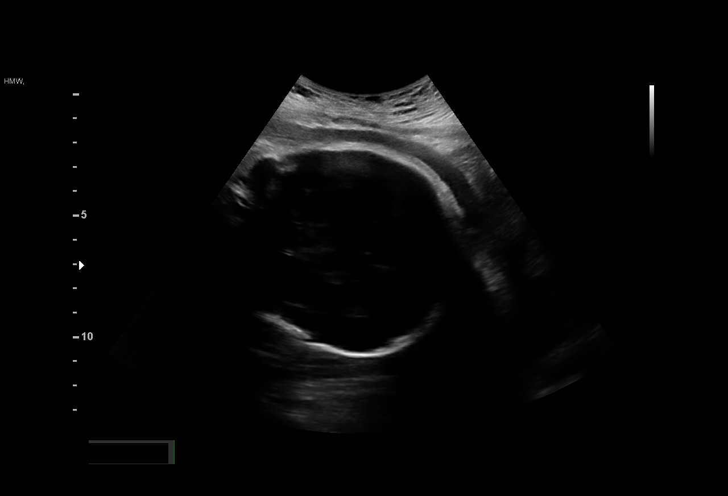
[im 3/24]
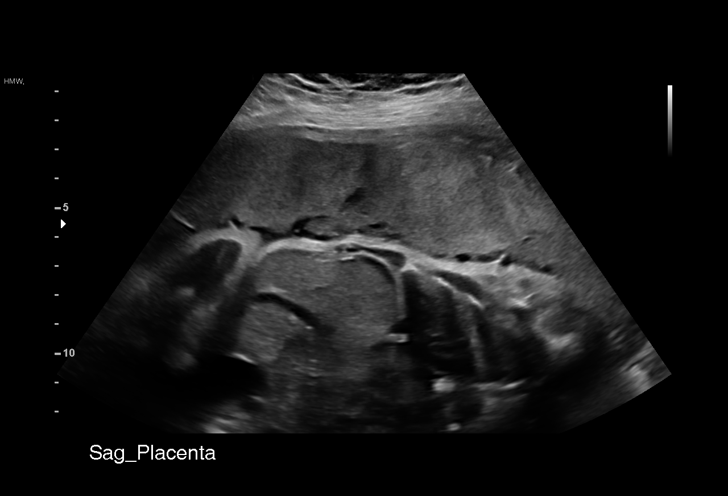
[im 5/24]
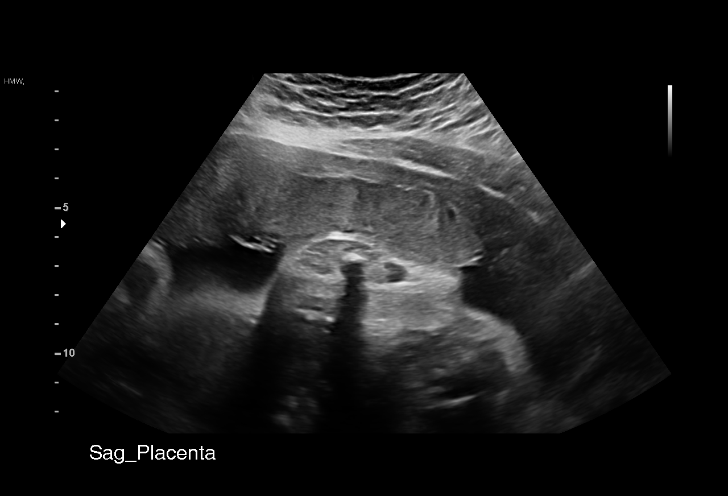
[im 6/24]
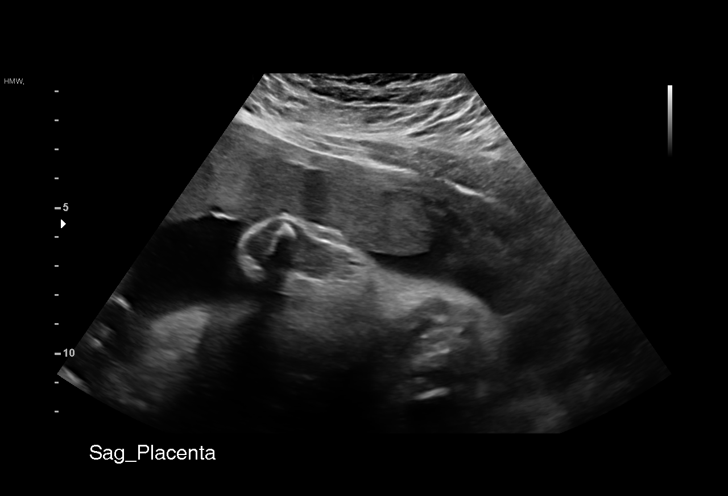
[im 8/24]
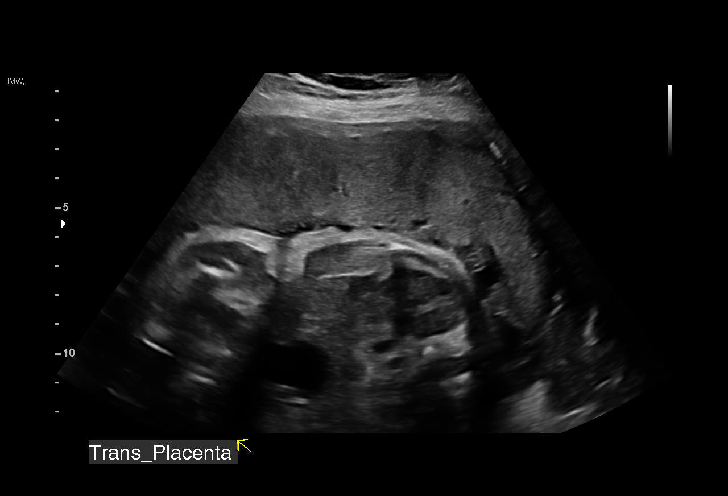
[im 9/24]
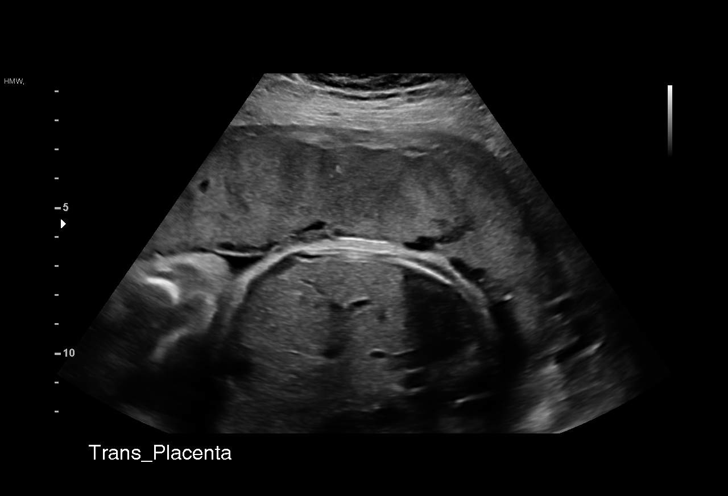
[im 11/24]
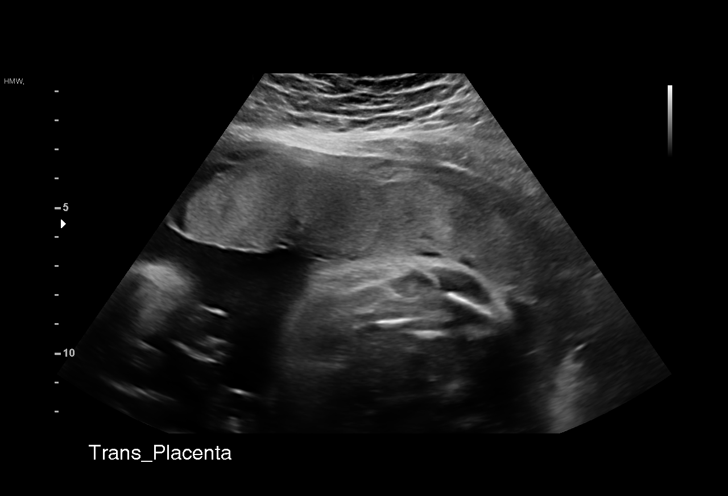
[im 13/24]
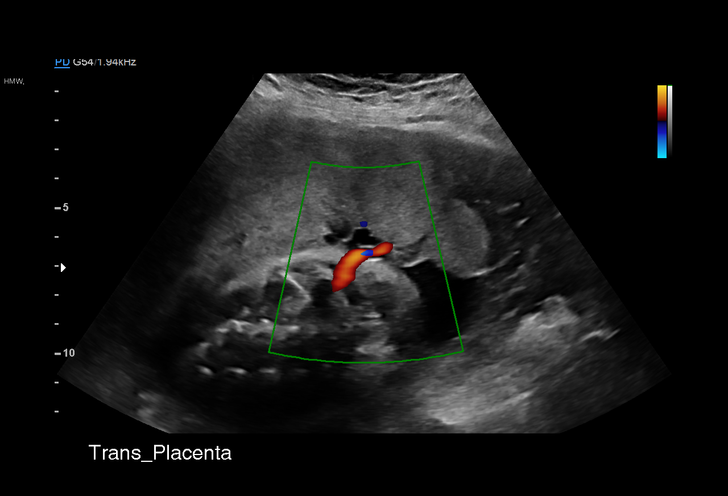
[im 14/24]
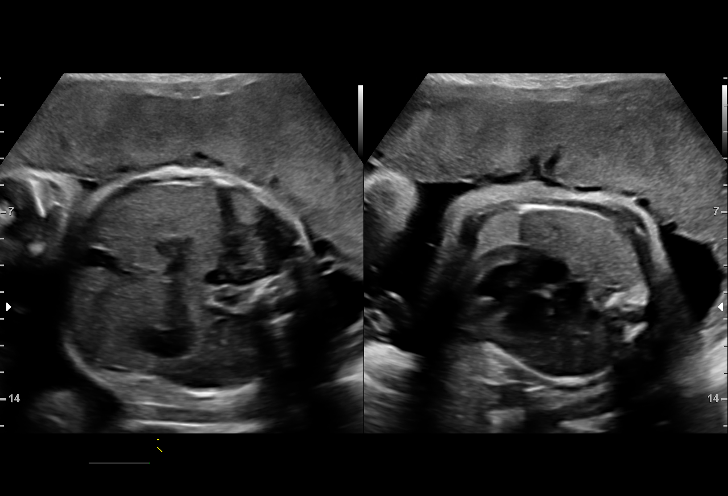
[im 16/24]
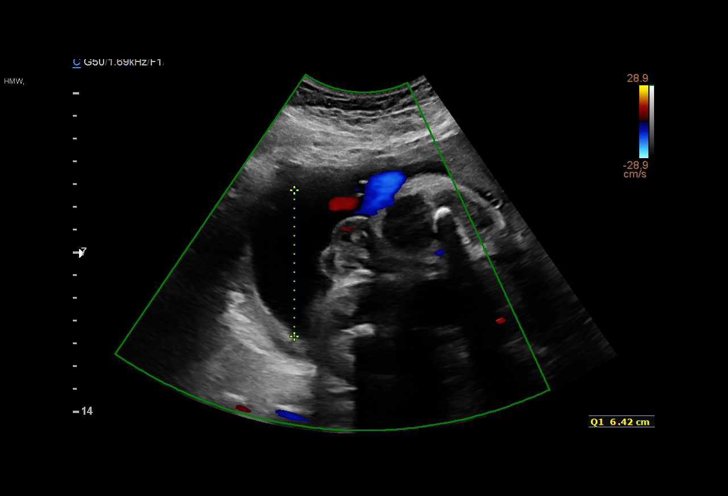
[im 17/24]
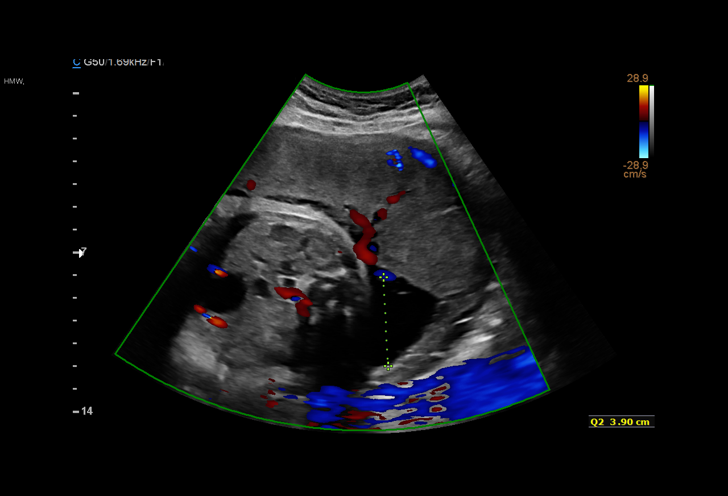
[im 19/24]
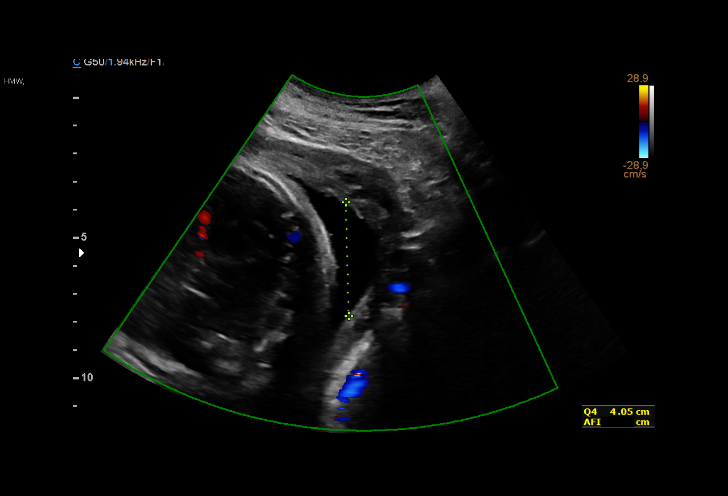
[im 21/24]
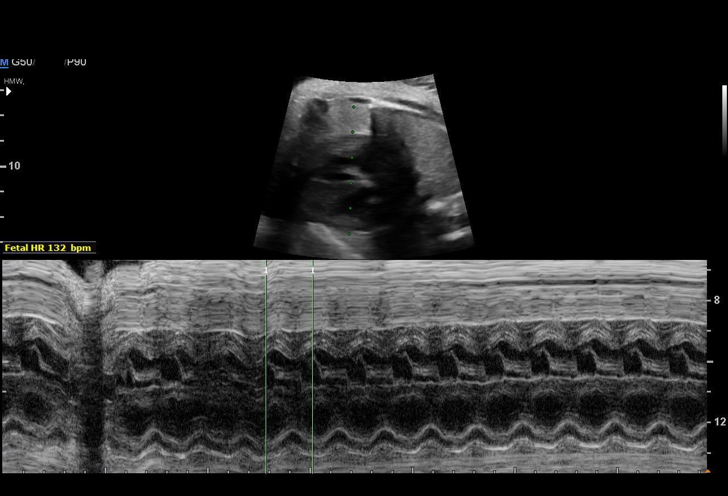
[im 22/24]
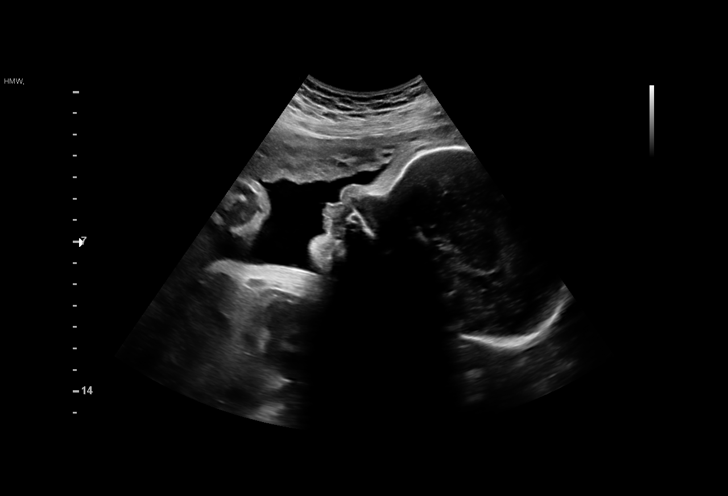
[im 24/24]
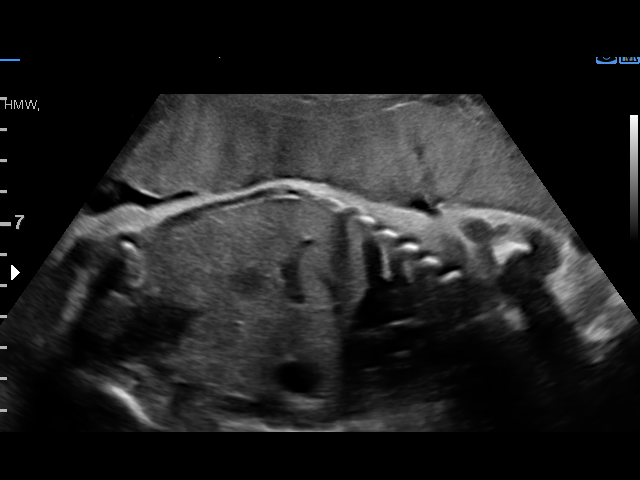

[15 of 24 positions shown; findings below may reference images not displayed]

1  US MFM OB LIMITED                    76815.01     DJUAN WIEST
 ----------------------------------------------------------------------

 ----------------------------------------------------------------------
Indications

  32 weeks gestation of pregnancy
  Vaginal bleeding in pregnancy, third trimester
  Late to prenatal care, third trimester
  Insufficient Prenatal Care
  Previous cesarean delivery, antepartum
  Poor obstetric history: Previous preterm
  delivery, antepartum
 ----------------------------------------------------------------------
Fetal Evaluation

 Num Of Fetuses:          1
 Fetal Heart Rate(bpm):   132
 Cardiac Activity:        Observed
 Presentation:            Cephalic
 Placenta:                Anterior
 P. Cord Insertion:       Visualized, central

 Amniotic Fluid
 AFI FV:      Within normal limits

 AFI Sum(cm)     %Tile       Largest Pocket(cm)
 17.82           66

 RUQ(cm)       RLQ(cm)       LUQ(cm)        LLQ(cm)


 Comment:    No placental abruption or previa identified.
OB History

 Gravidity:    5         Term:   2        Prem:   2        SAB:   1
 TOP:          0       Ectopic:  0        Living: 4
Gestational Age
 LMP:           32w 4d        Date:  09/24/18                 EDD:   07/01/19
 Best:          32w 4d     Det. By:  LMP  (09/24/18)          EDD:   07/01/19
Anatomy

 Thoracic:              Appears normal         Stomach:                Appears normal, left
                                                                       sided
 Diaphragm:             Appears normal         Bladder:                Appears normal
Cervix Uterus Adnexa

 Cervix
 Not visualized (advanced GA >47wks)

 Uterus
 No abnormality visualized.

 Left Ovary
 No adnexal mass visualized.

 Right Ovary
 No adnexal mass visualized.

 Cul De Sac
 No free fluid seen.

 Adnexa
 No abnormality visualized.
Impression

 Limited exam
 Vaginal bleeding in pregnancy
 No evidence of placenta previa or abruption.
Recommendations

 Follow up as clinically indicated.

## 2020-05-14 ENCOUNTER — Other Ambulatory Visit: Payer: Self-pay

## 2020-05-14 ENCOUNTER — Inpatient Hospital Stay (HOSPITAL_COMMUNITY)
Admission: AD | Admit: 2020-05-14 | Discharge: 2020-05-14 | Disposition: A | Discharge status: Home / Self Care | Payer: Medicaid Other | Attending: Obstetrics & Gynecology | Admitting: Obstetrics & Gynecology

## 2020-05-14 ENCOUNTER — Encounter (HOSPITAL_COMMUNITY): Payer: Self-pay | Admitting: Obstetrics & Gynecology

## 2020-05-14 DIAGNOSIS — Z3202 Encounter for pregnancy test, result negative: Secondary | ICD-10-CM

## 2020-05-14 DIAGNOSIS — O26891 Other specified pregnancy related conditions, first trimester: Secondary | ICD-10-CM | POA: Diagnosis not present

## 2020-05-14 DIAGNOSIS — Z87891 Personal history of nicotine dependence: Secondary | ICD-10-CM | POA: Diagnosis not present

## 2020-05-14 DIAGNOSIS — Z791 Long term (current) use of non-steroidal anti-inflammatories (NSAID): Secondary | ICD-10-CM | POA: Diagnosis not present

## 2020-05-14 DIAGNOSIS — Z3A01 Less than 8 weeks gestation of pregnancy: Secondary | ICD-10-CM | POA: Insufficient documentation

## 2020-05-14 DIAGNOSIS — N939 Abnormal uterine and vaginal bleeding, unspecified: Secondary | ICD-10-CM | POA: Diagnosis not present

## 2020-05-14 DIAGNOSIS — Z79899 Other long term (current) drug therapy: Secondary | ICD-10-CM | POA: Insufficient documentation

## 2020-05-14 DIAGNOSIS — O209 Hemorrhage in early pregnancy, unspecified: Secondary | ICD-10-CM

## 2020-05-14 LAB — CBC
HCT: 33.4 % — ABNORMAL LOW (ref 36.0–46.0)
Hemoglobin: 9.7 g/dL — ABNORMAL LOW (ref 12.0–15.0)
MCH: 21.5 pg — ABNORMAL LOW (ref 26.0–34.0)
MCHC: 29 g/dL — ABNORMAL LOW (ref 30.0–36.0)
MCV: 73.9 fL — ABNORMAL LOW (ref 80.0–100.0)
Platelets: 429 10*3/uL — ABNORMAL HIGH (ref 150–400)
RBC: 4.52 MIL/uL (ref 3.87–5.11)
RDW: 18.5 % — ABNORMAL HIGH (ref 11.5–15.5)
WBC: 9.4 10*3/uL (ref 4.0–10.5)
nRBC: 0 % (ref 0.0–0.2)

## 2020-05-14 LAB — HCG, QUANTITATIVE, PREGNANCY: hCG, Beta Chain, Quant, S: <1 m[IU]/mL (ref <5)

## 2020-05-14 LAB — ABO/RH: ABO/RH(D): B NEG

## 2020-05-14 LAB — HIV ANTIBODY (ROUTINE TESTING W REFLEX): HIV Screen 4th Generation wRfx: NONREACTIVE

## 2020-05-14 LAB — POCT PREGNANCY, URINE: Preg Test, Ur: NEGATIVE

## 2020-05-14 NOTE — MAU Note (Addendum)
Pt had positive HPT at home last month.  LMP was 03/03/2020. Started cramping a few days ago.   Two days ago she began bleeding, today it is heavier with clots. She has not seen anyone yet for prenatal care.

## 2020-05-14 NOTE — Discharge Instructions (Signed)
Abnormal Uterine Bleeding °Abnormal uterine bleeding is unusual bleeding from the uterus. It includes: °· Bleeding or spotting between periods. °· Bleeding after sex. °· Bleeding that is heavier than normal. °· Periods that last longer than usual. °· Bleeding after menopause. °Abnormal uterine bleeding can affect women at various stages in life, including teenagers, women in their reproductive years, pregnant women, and women who have reached menopause. Common causes of abnormal uterine bleeding include: °· Pregnancy. °· Growths of tissue (polyps). °· A noncancerous tumor in the uterus (fibroid). °· Infection. °· Cancer. °· Hormonal imbalances. °Any type of abnormal bleeding should be evaluated by a health care provider. Many cases are minor and simple to treat, while others are more serious. Treatment will depend on the cause of the bleeding. °Follow these instructions at home: °· Monitor your condition for any changes. °· Do not use tampons, douche, or have sex if told by your health care provider. °· Change your pads often. °· Get regular exams that include pelvic exams and cervical cancer screening. °· Keep all follow-up visits as told by your health care provider. This is important. °Contact a health care provider if: °· Your bleeding lasts for more than one week. °· You feel dizzy at times. °· You feel nauseous or you vomit. °Get help right away if: °· You pass out. °· Your bleeding soaks through a pad every hour. °· You have abdominal pain. °· You have a fever. °· You become sweaty or weak. °· You pass large blood clots from your vagina. °Summary °· Abnormal uterine bleeding is unusual bleeding from the uterus. °· Any type of abnormal bleeding should be evaluated by a health care provider. Many cases are minor and simple to treat, while others are more serious. °· Treatment will depend on the cause of the bleeding. °This information is not intended to replace advice given to you by your health care provider.  Make sure you discuss any questions you have with your health care provider. °Document Revised: 01/18/2018 Document Reviewed: 11/12/2016 °Elsevier Patient Education © 2020 Elsevier Inc. ° °

## 2020-05-14 NOTE — MAU Provider Note (Addendum)
Chief Complaint: Vaginal Bleeding   First Provider Initiated Contact with Patient 05/14/20 1524      SUBJECTIVE HPI: Laura Leonard is a 29 y.o. [id number] who presents to MAU reporting positive home pregnancy test 1 month ago; last menstrual period 03/03/2020.  She reports cramping a few days ago; not normal for her. She also reports vaginal bleeding that started 2 days ago.  She reports that the bleeding is heavier today with clots.  She has not sought prenatal care in the last month.  However, she is scheduled to see someone at [facility] on May 22, 2020.  Past Medical History:  Diagnosis Date  . Anemia   . Cesarean delivery delivered   . Gestational diabetes   . Headache    hx of migraines  . Hypertension    with 2nd pregnancy  . Pregnancy induced hypertension   . Trichomonas contact    Past Surgical History:  Procedure Laterality Date  . CESAREAN SECTION N/A 05/26/2016   Procedure: CESAREAN SECTION;  Surgeon: Tilda Burrow, MD;  Location: [facility] BIRTHING SUITES;  Service: Obstetrics;  Laterality: N/A;  . CESAREAN SECTION N/A 06/11/2019   Procedure: CESAREAN SECTION;  Surgeon: Conan Bowens, MD;  Location: [facility] LD ORS;  Service: Obstetrics;  Laterality: N/A;  . CESAREAN SECTION MULTI-GESTATIONAL  11/22/2017   Procedure: CESAREAN SECTION MULTI-GESTATIONAL;  Surgeon: Conan Bowens, MD;  Location: [facility] BIRTHING SUITES;  Service: Obstetrics;;   Social History   Socioeconomic History  . Marital status: Single    Spouse name: Not on file  . Number of children: Not on file  . Years of education: Not on file  . Highest education level: Not on file  Occupational History  . Not on file  Tobacco Use  . Smoking status: Former Smoker    Packs/day: 0.50    Types: Cigarettes    Quit date: 07/2017    Years since quitting: 2.8  . Smokeless tobacco: Never Used  Vaping Use  . Vaping Use: Never used  Substance and Sexual Activity  . Alcohol use: Yes    Comment: 3 months ago  as of 08/03/17  . Drug use: Not Currently    Types: Marijuana    Comment: positive on 11/19/17  . Sexual activity: Yes    Comment: last sex 2-3 wks ago  Other Topics Concern  . Not on file  Social History Narrative  . Not on file   Social Determinants of Health   Financial Resource Strain: Low Risk   . Difficulty of Paying Living Expenses: Not hard at all  Food Insecurity: No Food Insecurity  . Worried About Programme researcher, broadcasting/film/video in the Last Year: Never true  . Ran Out of Food in the Last Year: Never true  Transportation Needs: No Transportation Needs  . Lack of Transportation (Medical): No  . Lack of Transportation (Non-Medical): No  Physical Activity:   . Days of Exercise per Week:   . Minutes of Exercise per Session:   Stress: Stress Concern Present  . Feeling of Stress : To some extent  Social Connections:   . Frequency of Communication with Friends and Family:   . Frequency of Social Gatherings with Friends and Family:   . Attends Religious Services:   . Active Member of Clubs or Organizations:   . Attends Banker Meetings:   Marland Kitchen Marital Status:   Intimate Partner Violence: Not At Risk  . Fear of Current or Ex-Partner: No  .  Emotionally Abused: No  . Physically Abused: No  . Sexually Abused: No   No current facility-administered medications on file prior to encounter.   Current Outpatient Medications on File Prior to Encounter  Medication Sig Dispense Refill  . ferrous sulfate 325 (65 FE) MG tablet Take 1 tablet (325 mg total) by mouth daily with breakfast. 30 tablet 1  . NIFEdipine (ADALAT CC) 30 MG 24 hr tablet Take 1 tablet (30 mg total) by mouth daily. 30 tablet 1  . benzocaine (ORAJEL) 10 % mucosal gel Use as directed 1 application in the mouth or throat as needed for mouth pain.    Marland Kitchen ibuprofen (ADVIL) 800 MG tablet Take 1 tablet (800 mg total) by mouth every 6 (six) hours as needed for moderate pain. 30 tablet 0  . oxyCODONE-acetaminophen  (PERCOCET/ROXICET) 5-325 MG tablet Take 1-2 tablets by mouth every 4 (four) hours as needed for moderate pain. 30 tablet 0  . Prenatal Vit-Fe Fumarate-FA (PRENATAL MULTIVITAMIN) TABS tablet Take 1 tablet by mouth daily at 12 noon.    . senna-docusate (SENOKOT-S) 8.6-50 MG tablet Take 2 tablets by mouth daily. 30 tablet 1   No Known Allergies  ROS:  Review of Systems  Constitutional: Negative.   HENT: Negative.   Eyes: Negative.   Respiratory: Negative.   Cardiovascular: Negative.   Gastrointestinal: Negative.   Endocrine: Negative.   Genitourinary: Positive for pelvic pain (cramping for a "few days") and vaginal bleeding (x 2 days; heavier with clots started today).  Musculoskeletal: Negative.   Skin: Negative.   Allergic/Immunologic: Negative.   Neurological: Negative.   Hematological: Negative.   Psychiatric/Behavioral: Negative.     I have reviewed patient's Past Medical Hx, Surgical Hx, Family Hx, Social Hx, medications and allergies.   Physical Exam   Patient Vitals for the past 24 hrs:  BP Temp Temp src Pulse Resp SpO2 Height Weight  05/14/20 1441 137/90 98.5 F (36.9 C) Oral 85 18 99 % 5\' 5"  (1.651 m) 119.6 kg   Physical Exam Vitals and nursing note reviewed.  Constitutional:      Appearance: Normal appearance. She is obese.  HENT:     Head: Normocephalic and atraumatic.  Cardiovascular:     Rate and Rhythm: Normal rate.  Pulmonary:     Effort: Pulmonary effort is normal.  Genitourinary:    Comments: deferred Musculoskeletal:     Cervical back: Normal range of motion.  Neurological:     Mental Status: She is alert and oriented to person, place, and time.  Psychiatric:        Mood and Affect: Mood normal.        Behavior: Behavior normal.        Thought Content: Thought content normal.        Judgment: Judgment normal.     MDM Patient denies any concerning symptoms in need of emergent evaluation. Patient advised that due to the (+) HPT and our (-) UPT,  a HCG level has been ordered. Patient will be discharged to home to await results. Phone number verified and if able to leave voice message on the number. Advised that if HCG level is positive and symptoms are concerning enough, she may be asked to come back in for further evaluation and testing. Patient verbalized an understanding of the plan of care and agrees.   Results for orders placed or performed during the hospital encounter of 05/14/20 (from the past 24 hour(s))  HIV Antibody (routine testing w rflx)  Status: None   Collection Time: 05/14/20  2:56 PM  Result Value Ref Range   HIV Screen 4th Generation wRfx Non Reactive Non Reactive  ABO/Rh     Status: None   Collection Time: 05/14/20  2:56 PM  Result Value Ref Range   ABO/RH(D)      B NEG Performed at [facility], [address] [place], [place] [address]   CBC     Status: Abnormal   Collection Time: 05/14/20  3:01 PM  Result Value Ref Range   WBC 9.4 4.0 - 10.5 K/uL   RBC 4.52 3.87 - 5.11 MIL/uL   Hemoglobin 9.7 (L) 12.0 - 15.0 g/dL   HCT 09.3 (L) 36 - 46 %   MCV 73.9 (L) 80.0 - 100.0 fL   MCH 21.5 (L) 26.0 - 34.0 pg   MCHC 29.0 (L) 30.0 - 36.0 g/dL   RDW 81.8 (H) 29.9 - 37.1 %   Platelets 429 (H) 150 - 400 K/uL   nRBC 0.0 0.0 - 0.2 %  hCG, quantitative, pregnancy     Status: None   Collection Time: 05/14/20  3:01 PM  Result Value Ref Range   hCG, Beta Chain, Quant, S <1 <5 mIU/mL  Pregnancy, urine POC     Status: None   Collection Time: 05/14/20  3:11 PM  Result Value Ref Range   Preg Test, Ur NEGATIVE NEGATIVE    ASSESSMENT MSE Complete  PLAN Discharge patient to seek non-emergent medical care at ED or GYN provider.  Raelyn Mora, CNM 05/14/2020 3:33 PM

## 2020-05-14 NOTE — MAU Note (Signed)
Neg UPT, neg blood preg test.  Episode resolved. preg removed from OB hx.  Pt was NOT preg.

## 2024-07-02 ENCOUNTER — Encounter: Payer: Self-pay | Admitting: Nurse Practitioner

## 2024-07-02 ENCOUNTER — Ambulatory Visit: Admitting: Nurse Practitioner

## 2024-09-04 ENCOUNTER — Telehealth: Admitting: Nurse Practitioner
